# Patient Record
Sex: Female | Born: 1980 | Race: White | Hispanic: No | Marital: Married | Smoking: Never smoker
Health system: Southern US, Community
[De-identification: ages and names within clinical notes are randomized; demographics above are authoritative.]

## PROBLEM LIST (undated history)

## (undated) DIAGNOSIS — R002 Palpitations: Secondary | ICD-10-CM

## (undated) DIAGNOSIS — I10 Essential (primary) hypertension: Secondary | ICD-10-CM

## (undated) HISTORY — DX: Palpitations: R00.2

## (undated) HISTORY — PX: NO PAST SURGERIES: SHX2092

---

## 2001-08-13 ENCOUNTER — Encounter: Payer: Self-pay | Admitting: Unknown Physician Specialty

## 2001-08-13 ENCOUNTER — Ambulatory Visit (HOSPITAL_COMMUNITY): Admission: RE | Admit: 2001-08-13 | Discharge: 2001-08-13 | Payer: Self-pay | Admitting: Unknown Physician Specialty

## 2013-05-01 HISTORY — PX: BREAST BIOPSY: SHX20

## 2014-03-04 ENCOUNTER — Other Ambulatory Visit: Payer: Self-pay | Admitting: Unknown Physician Specialty

## 2014-03-04 DIAGNOSIS — N6310 Unspecified lump in the right breast, unspecified quadrant: Secondary | ICD-10-CM

## 2014-03-09 ENCOUNTER — Other Ambulatory Visit: Payer: Self-pay | Admitting: Unknown Physician Specialty

## 2014-03-09 DIAGNOSIS — N6310 Unspecified lump in the right breast, unspecified quadrant: Secondary | ICD-10-CM

## 2014-03-12 ENCOUNTER — Ambulatory Visit
Admission: RE | Admit: 2014-03-12 | Discharge: 2014-03-12 | Disposition: A | Discharge status: Home / Self Care | Payer: 59 | Source: Ambulatory Visit | Attending: Unknown Physician Specialty | Admitting: Unknown Physician Specialty

## 2014-03-12 DIAGNOSIS — N6310 Unspecified lump in the right breast, unspecified quadrant: Secondary | ICD-10-CM

## 2015-11-19 ENCOUNTER — Ambulatory Visit: Payer: Self-pay

## 2015-11-19 ENCOUNTER — Ambulatory Visit (INDEPENDENT_AMBULATORY_CARE_PROVIDER_SITE_OTHER): Payer: 59

## 2015-11-19 ENCOUNTER — Ambulatory Visit (INDEPENDENT_AMBULATORY_CARE_PROVIDER_SITE_OTHER): Payer: 59 | Admitting: Podiatry

## 2015-11-19 ENCOUNTER — Encounter: Payer: Self-pay | Admitting: Podiatry

## 2015-11-19 VITALS — BP 137/89 | HR 82 | Resp 12

## 2015-11-19 DIAGNOSIS — M722 Plantar fascial fibromatosis: Secondary | ICD-10-CM

## 2015-11-19 DIAGNOSIS — M79671 Pain in right foot: Secondary | ICD-10-CM

## 2015-11-19 DIAGNOSIS — M79672 Pain in left foot: Secondary | ICD-10-CM

## 2015-11-19 MED ORDER — DICLOFENAC SODIUM 75 MG PO TBEC: 75.0000 mg | DELAYED_RELEASE_TABLET | Freq: Two times a day (BID) | ORAL | Status: DC

## 2015-11-19 MED ORDER — TRIAMCINOLONE ACETONIDE 10 MG/ML IJ SUSP
10.0000 mg | Freq: Once | INTRAMUSCULAR | Status: AC
  Administered 2015-11-19: 10 mg

## 2015-11-19 NOTE — Patient Instructions (Signed)
Plantar Fasciitis Plantar fasciitis is a painful foot condition that affects the heel. It occurs when the band of tissue that connects the toes to the heel bone (plantar fascia) becomes irritated. This can happen after exercising too much or doing other repetitive activities (overuse injury). The pain from plantar fasciitis can range from mild irritation to severe pain that makes it difficult for you to walk or move. The pain is usually worse in the morning or after you have been sitting or lying down for a while. CAUSES This condition may be caused by:  Standing for long periods of time.  Wearing shoes that do not fit.  Doing high-impact activities, including running, aerobics, and ballet.  Being overweight.  Having an abnormal way of walking (gait).  Having tight calf muscles.  Having high arches in your feet.  Starting a new athletic activity. SYMPTOMS The main symptom of this condition is heel pain. Other symptoms include:  Pain that gets worse after activity or exercise.  Pain that is worse in the morning or after resting.  Pain that goes away after you walk for a few minutes. DIAGNOSIS This condition may be diagnosed based on your signs and symptoms. Your health care provider will also do a physical exam to check for:  A tender area on the bottom of your foot.  A high arch in your foot.  Pain when you move your foot.  Difficulty moving your foot. You may also need to have imaging studies to confirm the diagnosis. These can include:  X-rays.  Ultrasound.  MRI. TREATMENT  Treatment for plantar fasciitis depends on the severity of the condition. Your treatment may include:  Rest, ice, and over-the-counter pain medicines to manage your pain.  Exercises to stretch your calves and your plantar fascia.  A splint that holds your foot in a stretched, upward position while you sleep (night splint).  Physical therapy to relieve symptoms and prevent problems in the  future.  Cortisone injections to relieve severe pain.  Extracorporeal shock wave therapy (ESWT) to stimulate damaged plantar fascia with electrical impulses. It is often used as a last resort before surgery.  Surgery, if other treatments have not worked after 12 months. HOME CARE INSTRUCTIONS  Take medicines only as directed by your health care provider.  Avoid activities that cause pain.  Roll the bottom of your foot over a bag of ice or a bottle of cold water. Do this for 20 minutes, 3-4 times a day.  Perform simple stretches as directed by your health care provider.  Try wearing athletic shoes with air-sole or gel-sole cushions or soft shoe inserts.  Wear a night splint while sleeping, if directed by your health care provider.  Keep all follow-up appointments with your health care provider. PREVENTION   Do not perform exercises or activities that cause heel pain.  Consider finding low-impact activities if you continue to have problems.  Lose weight if you need to. The best way to prevent plantar fasciitis is to avoid the activities that aggravate your plantar fascia. SEEK MEDICAL CARE IF:  Your symptoms do not go away after treatment with home care measures.  Your pain gets worse.  Your pain affects your ability to move or do your daily activities.   This information is not intended to replace advice given to you by your health care provider. Make sure you discuss any questions you have with your health care provider.   Document Released: 01/10/2001 Document Revised: 01/06/2015 Document Reviewed: 02/25/2014 Elsevier   Interactive Patient Education 2016 Elsevier Inc.  

## 2015-11-19 NOTE — Progress Notes (Signed)
   Subjective:    Patient ID: Jeanette Morales, female    DOB: 1981/03/13, 35 y.o.   MRN: [id number]  HPI  Chief Complaint  Patient presents with  . Plantar Fasciitis    RT BOTTOM OF THE HEEL IS PAINFUL FOR ABOUT 1 YEAR. HEEL IS WORSE WHEN SITTING FOR LONG TERM THE STAND UP. TRIED ICE, TAPING, NSAID-NO RELIEF.     Review of Systems  Musculoskeletal: Positive for gait problem.       Objective:   Physical Exam        Assessment & Plan:

## 2015-11-24 NOTE — Progress Notes (Signed)
Subjective:     Patient ID: Jeanette Morales, female   DOB: 12/10/1980, 35 y.o.   MRN: [id number]  HPI patient states that she's having a lot of pain in the right heel for the last year 2 and also she has a long-term history of pain in the forefoot left with sesamoid injury which is been treated with several cortisone injections which have not given relief and previous MRI indicated that there was damage to the fibular sesamoid according to patient   Review of Systems  All other systems reviewed and are negative.      Objective:   Physical Exam  Constitutional: She is oriented to person, place, and time.  Cardiovascular: Intact distal pulses.   Musculoskeletal: Normal range of motion.  Neurological: She is oriented to person, place, and time.  Skin: Skin is warm.  Nursing note and vitals reviewed.  neurovascular status intact muscle strength adequate range of motion within normal limits with patient found to have exquisite discomfort plantar aspect right heel at the insertional point of the tendon into the calcaneus with fluid buildup around the medial band and also noted to have hypertrophy and irritation around the fibular sesamoid left with history of problems with this for at least 5 years. 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 Today were to focus on the heel and I injected the plantar fascia 3 mg Kenalog 5 mg Xylocaine and applied fascial brace on with instructions on usage along with diclofenac 75 mg twice a day. I then discussed the fibular sesamoid with given his 5 year history that it's most likely going to need to be removed and I reviewed that fact with her  X-ray report indicated plantar spur right and on the left it did indicate there is been some reactive changes  around the fibular sesamoid

## 2015-12-03 ENCOUNTER — Ambulatory Visit (INDEPENDENT_AMBULATORY_CARE_PROVIDER_SITE_OTHER): Payer: 59 | Admitting: Podiatry

## 2015-12-03 ENCOUNTER — Encounter: Payer: Self-pay | Admitting: Podiatry

## 2015-12-03 ENCOUNTER — Ambulatory Visit (INDEPENDENT_AMBULATORY_CARE_PROVIDER_SITE_OTHER): Payer: 59

## 2015-12-03 VITALS — BP 117/77 | HR 84 | Resp 12

## 2015-12-03 DIAGNOSIS — M79671 Pain in right foot: Secondary | ICD-10-CM

## 2015-12-03 DIAGNOSIS — M779 Enthesopathy, unspecified: Secondary | ICD-10-CM

## 2015-12-03 DIAGNOSIS — M722 Plantar fascial fibromatosis: Secondary | ICD-10-CM

## 2015-12-05 NOTE — Progress Notes (Signed)
Subjective:     Patient ID: Jeanette Morales, female   DOB: 1980-10-11, 35 y.o.   MRN: [id number]  HPI patient presents stating that her heel feels quite a bit better but she still has pain in the fibular sesamoidal area left or in the under part of the foot   Review of Systems     Objective:   Physical Exam Neurovascular status intact muscle strength adequate with patient found to have diminished discomfort in the plantar fascia right and is also noted to have discomfort around the fibular complex left which appears to be more fibular then tibial    Assessment:     Improving plantar fasciitis right but still present with fibular probable sesamoiditis left    Plan:     H&P and x-ray of the left foot with marker was reviewed. Today I went ahead and I scanned for custom orthotics to reduce plantar pressure against the fibular complex and also the right heel and discussed that fibular sesamoidectomy may be necessary one point in the future  X-ray indicated that the marker is in proximity to the fibular sesamoid left

## 2015-12-06 ENCOUNTER — Encounter: Payer: Self-pay | Admitting: *Deleted

## 2015-12-06 ENCOUNTER — Other Ambulatory Visit: Payer: Self-pay | Admitting: *Deleted

## 2015-12-07 ENCOUNTER — Ambulatory Visit (INDEPENDENT_AMBULATORY_CARE_PROVIDER_SITE_OTHER): Payer: 59 | Admitting: Cardiovascular Disease

## 2015-12-07 ENCOUNTER — Encounter: Payer: Self-pay | Admitting: Cardiovascular Disease

## 2015-12-07 VITALS — BP 126/84 | HR 73 | Ht 64.0 in | Wt 195.0 lb

## 2015-12-07 DIAGNOSIS — R002 Palpitations: Secondary | ICD-10-CM | POA: Diagnosis not present

## 2015-12-07 DIAGNOSIS — Z713 Dietary counseling and surveillance: Secondary | ICD-10-CM

## 2015-12-07 DIAGNOSIS — Z7189 Other specified counseling: Secondary | ICD-10-CM

## 2015-12-07 DIAGNOSIS — E785 Hyperlipidemia, unspecified: Secondary | ICD-10-CM

## 2015-12-07 DIAGNOSIS — Z7182 Exercise counseling: Secondary | ICD-10-CM

## 2015-12-07 NOTE — Patient Instructions (Signed)
Medication Instructions:  Continue all current medications.  Labwork: None.   Testing/Procedures: None.  Follow-Up: As needed.   Any Other Special Instructions Will Be Listed Below (If Applicable).  If you need a refill on your cardiac medications before your next appointment, please call your pharmacy.  

## 2015-12-07 NOTE — Progress Notes (Signed)
CARDIOLOGY CONSULT NOTE  Patient ID: Jeanette Morales MRN: [id number] DOB/AGE: 1980-06-10 35 y.o.  Admit date: (Not on file) Primary Physician: Dorthea Cove, MD Referring Physician: Dimas Aguas MD  Reason for Consultation: palpitations  HPI: The patient is a 35 year old woman who presents for the evaluation of palpitations. ECG in late July demonstrated sinus rhythm with PVCs. She went to the podiatrist who palpated her pedal pulses and told her she needs an ECG. She occasionally has palpitations but they are not debilitating. They're not associated with chest pain, shortness of breath, or dizziness. She says she does not sleep well as it is often interrupted. Denies sleep apnea. She said she has hyperlipidemia and was told she needs medications but prefers to try diet and exercise. She previously lost 65 pounds and got on oh 117 pounds but all this weight back on. She says "I love to eat ".  Fam: Father had stents at 8. Has HTN.   Allergies  Allergen Reactions  . Lexapro [Escitalopram Oxalate]     agitation    Current Outpatient Prescriptions  Medication Sig Dispense Refill  . cyclobenzaprine (FLEXERIL) 10 MG tablet Take 1 tablet by mouth 2 (two) times daily as needed.  0  . diclofenac (VOLTAREN) 75 MG EC tablet Take 1 tablet (75 mg total) by mouth 2 (two) times daily. (Patient taking differently: Take 75 mg by mouth 2 (two) times daily as needed. ) 50 tablet 2  . JUNEL 1/20 1-20 MG-MCG tablet Take 1 tablet by mouth daily.  4  . meloxicam (MOBIC) 15 MG tablet Take 15 mg by mouth daily as needed.   2  . traZODone (DESYREL) 50 MG tablet Take 50 mg by mouth at bedtime as needed.   0   No current facility-administered medications for this visit.     Past Medical History:  Diagnosis Date  . Palpitations     Past Surgical History:  Procedure Laterality Date  . NO PAST SURGERIES      Social History   Social History  . Marital status: Married    Spouse name: N/A  .  Number of children: N/A  . Years of education: N/A   Occupational History  . Not on file.   Social History Main Topics  . Smoking status: Never Smoker  . Smokeless tobacco: Not on file  . Alcohol use No  . Drug use: No  . Sexual activity: Not on file   Other Topics Concern  . Not on file   Social History Narrative  . No narrative on file       Prior to Admission medications   Medication Sig Start Date End Date Taking? Authorizing Provider  cyclobenzaprine (FLEXERIL) 10 MG tablet Take 1 tablet by mouth 2 (two) times daily as needed. 11/12/15   Historical Provider, MD  diclofenac (VOLTAREN) 75 MG EC tablet Take 1 tablet (75 mg total) by mouth 2 (two) times daily. 11/19/15   Lenn Sink, DPM  JUNEL 1/20 1-20 MG-MCG tablet Take 1 tablet by mouth daily. 09/30/15   Historical Provider, MD  meloxicam (MOBIC) 15 MG tablet Take 15 mg by mouth daily. 09/01/15   Historical Provider, MD  traZODone (DESYREL) 50 MG tablet Take 50 mg by mouth at bedtime. 11/19/15   Historical Provider, MD     Review of systems complete and found to be negative unless listed above in HPI     Physical exam Blood pressure 126/84, pulse 73, height  (1.626  m), weight 195 lb (88.5 kg). General: NAD Neck: No JVD, no thyromegaly or thyroid nodule.  Lungs: Clear to auscultation bilaterally with normal respiratory effort. CV: Nondisplaced PMI. Regular rate and rhythm, normal S1/S2, no S3/S4, no murmur.  No peripheral edema.  No carotid bruit.  Normal pedal pulses.  Abdomen: Soft, nontender, obese.  Skin: Intact without lesions or rashes.  Neurologic: Alert and oriented x 3.  Psych: Normal affect. Extremities: No clubbing or cyanosis.  HEENT: Normal.   ECG: Most recent ECG reviewed.  Labs:  No results found for: WBC, HGB, HCT, MCV, PLT No results for input(s): NA, K, CL, CO2, BUN, CREATININE, CALCIUM, PROT, BILITOT, ALKPHOS, ALT, AST, GLUCOSE in the last 168 hours.  Invalid input(s): LABALBU No  results found for: CKTOTAL, CKMB, CKMBINDEX, TROPONINI No results found for: CHOL No results found for: HDL No results found for: LDLCALC No results found for: TRIG No results found for: CHOLHDL No results found for: LDLDIRECT       Studies: No results found.  ASSESSMENT AND PLAN:  1. Palpitations: Rarely symptomatic. Benign. No medical therapy warranted at this point.  2. Obesity: Exercise and dietary counseling provided.  3. Hyperlipidemia: Pt wants to attempt diet/exercise.   Dispo: prn.   Signed: Prentice DockerSuresh Kosei Rhodes, M.D., F.A.C.C.  12/07/2015, 9:25 AM

## 2015-12-24 ENCOUNTER — Ambulatory Visit: Payer: 59 | Admitting: *Deleted

## 2015-12-24 DIAGNOSIS — M722 Plantar fascial fibromatosis: Secondary | ICD-10-CM

## 2015-12-24 NOTE — Progress Notes (Signed)
Patient ID: Jeanette Morales, female   DOB: 1980/06/03, 35 y.o.   MRN: [id number]  Patient presents for orthotic pick up.  Verbal and written break in and wear instructions given.  Patient will follow up in 4 weeks if symptoms worsen or fail to improve.

## 2015-12-24 NOTE — Patient Instructions (Signed)

## 2016-01-24 ENCOUNTER — Encounter: Payer: Self-pay | Admitting: Podiatry

## 2016-01-24 ENCOUNTER — Ambulatory Visit (INDEPENDENT_AMBULATORY_CARE_PROVIDER_SITE_OTHER): Payer: 59 | Admitting: Podiatry

## 2016-01-24 DIAGNOSIS — M8930 Hypertrophy of bone, unspecified site: Secondary | ICD-10-CM

## 2016-01-24 DIAGNOSIS — M722 Plantar fascial fibromatosis: Secondary | ICD-10-CM

## 2016-01-24 MED ORDER — TRIAMCINOLONE ACETONIDE 10 MG/ML IJ SUSP
10.0000 mg | Freq: Once | INTRAMUSCULAR | Status: AC
  Administered 2016-01-24: 10 mg

## 2016-01-25 NOTE — Progress Notes (Signed)
Subjective:     Patient ID: Jeanette Morales, female   DOB: January 27, 1981, 35 y.o.   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 Discussed removal of the fibular sesamoid left and patient wants to get this done but needs to wait till later in the year and I reviewed what would be required

## 2016-02-21 ENCOUNTER — Ambulatory Visit: Payer: 59 | Admitting: Podiatry

## 2016-03-17 ENCOUNTER — Ambulatory Visit: Payer: 59 | Admitting: Podiatry

## 2016-04-20 ENCOUNTER — Ambulatory Visit: Payer: 59 | Admitting: Podiatry

## 2017-05-01 HISTORY — PX: FOOT SURGERY: SHX648

## 2017-09-03 DIAGNOSIS — Z01419 Encounter for gynecological examination (general) (routine) without abnormal findings: Secondary | ICD-10-CM | POA: Diagnosis not present

## 2018-01-24 DIAGNOSIS — M79672 Pain in left foot: Secondary | ICD-10-CM | POA: Diagnosis not present

## 2018-01-24 DIAGNOSIS — M25872 Other specified joint disorders, left ankle and foot: Secondary | ICD-10-CM | POA: Diagnosis not present

## 2018-01-24 DIAGNOSIS — M79671 Pain in right foot: Secondary | ICD-10-CM | POA: Diagnosis not present

## 2018-01-24 DIAGNOSIS — M722 Plantar fascial fibromatosis: Secondary | ICD-10-CM | POA: Diagnosis not present

## 2018-01-24 DIAGNOSIS — M2580 Other specified joint disorders, unspecified joint: Secondary | ICD-10-CM | POA: Diagnosis not present

## 2018-02-04 DIAGNOSIS — M879 Osteonecrosis, unspecified: Secondary | ICD-10-CM | POA: Diagnosis not present

## 2018-02-04 DIAGNOSIS — M899 Disorder of bone, unspecified: Secondary | ICD-10-CM | POA: Diagnosis not present

## 2018-02-04 DIAGNOSIS — M87075 Idiopathic aseptic necrosis of left foot: Secondary | ICD-10-CM | POA: Diagnosis not present

## 2018-02-04 DIAGNOSIS — M79672 Pain in left foot: Secondary | ICD-10-CM | POA: Diagnosis not present

## 2018-03-15 DIAGNOSIS — M79672 Pain in left foot: Secondary | ICD-10-CM | POA: Diagnosis not present

## 2018-03-15 DIAGNOSIS — M6281 Muscle weakness (generalized): Secondary | ICD-10-CM | POA: Diagnosis not present

## 2018-03-15 DIAGNOSIS — M25675 Stiffness of left foot, not elsewhere classified: Secondary | ICD-10-CM | POA: Diagnosis not present

## 2018-04-05 DIAGNOSIS — M6281 Muscle weakness (generalized): Secondary | ICD-10-CM | POA: Diagnosis not present

## 2018-04-05 DIAGNOSIS — M25675 Stiffness of left foot, not elsewhere classified: Secondary | ICD-10-CM | POA: Diagnosis not present

## 2018-04-05 DIAGNOSIS — M79672 Pain in left foot: Secondary | ICD-10-CM | POA: Diagnosis not present

## 2019-09-10 ENCOUNTER — Telehealth: Payer: Self-pay | Admitting: Adult Health

## 2019-09-10 NOTE — Telephone Encounter (Signed)

## 2019-09-11 ENCOUNTER — Ambulatory Visit (INDEPENDENT_AMBULATORY_CARE_PROVIDER_SITE_OTHER): Payer: Managed Care, Other (non HMO) | Admitting: Adult Health

## 2019-09-11 ENCOUNTER — Encounter: Payer: Self-pay | Admitting: Adult Health

## 2019-09-11 ENCOUNTER — Other Ambulatory Visit: Payer: Self-pay

## 2019-09-11 VITALS — BP 141/92 | HR 82 | Ht 64.0 in | Wt 229.0 lb

## 2019-09-11 DIAGNOSIS — N9089 Other specified noninflammatory disorders of vulva and perineum: Secondary | ICD-10-CM | POA: Diagnosis not present

## 2019-09-11 NOTE — Progress Notes (Signed)
Patient ID: Jeanette Morales, female   DOB: [birth year], 39 y.o.   MRN: [id number] History of Present Illness:  Jeanette Morales is a 39 year old white female,married, G1P1, in as new pt, complaining of having irritation and pain in right labia since February, has seen Jeanette Morales and was treated for yeast, and herpes zoster, without relief, and she had biopsy Monday, but she does not feel it was at right spot. She tried temovate shirt time too. She says it seemed to start after having had steroids. Her HSV 1&2 cultures both negative and HSV IgG was negative.  PCP is Jeanette Cove MD.   Current Medications, Allergies, Past Medical History, Past Surgical History, Family History and Social History were reviewed in [employer] record.     Review of Systems: Has had irrigation and pain in right labia since February     Physical Exam:BP (!) 141/92 (BP Location: Left Arm, Patient Position: Sitting, Cuff Size: Large)   Pulse 82   Ht 5\' 4"  (1.626 m)   Wt 229 lb (103.9 kg)   LMP 08/21/2019 (Approximate)   BMI 39.31 kg/m  General:  Well developed, well nourished, no acute distress Skin:  Warm and dry Lungs; Clear to auscultation bilaterally Cardiovascular: Regular rate and rhythm Pelvic:  External genitalia is normal in appearance, the inner right labia minor has exudate from recent biopsy, and has an area that is puffy and raised, and tender. Psych:  No Morales changes, alert and cooperative,seems happy AA is o Fall risk is low PHQ 9 score is 10 no SI. Examination chaperoned by 08/23/2019 LPN   Impression and Plan: 1. Vulvar irritation  -Return in 2 weeks to recheck when healed and has biopsy pathology  Total time 35 minutes, reviewing Jeanette Morales records, exam and then discussing with her differential diagnosis of lichen simplex chronicus, and even SCC,showed her pictures in [facility] by Jeanette Morales.

## 2019-09-23 ENCOUNTER — Telehealth: Payer: Self-pay | Admitting: Adult Health

## 2019-09-23 NOTE — Telephone Encounter (Signed)

## 2019-09-24 ENCOUNTER — Encounter: Payer: Self-pay | Admitting: Adult Health

## 2019-09-24 ENCOUNTER — Ambulatory Visit (INDEPENDENT_AMBULATORY_CARE_PROVIDER_SITE_OTHER): Payer: Managed Care, Other (non HMO) | Admitting: Adult Health

## 2019-09-24 ENCOUNTER — Other Ambulatory Visit: Payer: Self-pay

## 2019-09-24 VITALS — BP 159/100 | HR 79 | Ht 65.0 in | Wt 232.0 lb

## 2019-09-24 DIAGNOSIS — N3281 Overactive bladder: Secondary | ICD-10-CM | POA: Insufficient documentation

## 2019-09-24 DIAGNOSIS — L900 Lichen sclerosus et atrophicus: Secondary | ICD-10-CM | POA: Diagnosis not present

## 2019-09-24 DIAGNOSIS — N9089 Other specified noninflammatory disorders of vulva and perineum: Secondary | ICD-10-CM | POA: Diagnosis not present

## 2019-09-24 DIAGNOSIS — N393 Stress incontinence (female) (male): Secondary | ICD-10-CM | POA: Insufficient documentation

## 2019-09-24 DIAGNOSIS — R030 Elevated blood-pressure reading, without diagnosis of hypertension: Secondary | ICD-10-CM | POA: Insufficient documentation

## 2019-09-24 DIAGNOSIS — L570 Actinic keratosis: Secondary | ICD-10-CM | POA: Insufficient documentation

## 2019-09-24 LAB — POCT URINALYSIS DIPSTICK
Blood, UA: NEGATIVE
Glucose, UA: NEGATIVE
Leukocytes, UA: NEGATIVE
Nitrite, UA: NEGATIVE
Protein, UA: NEGATIVE

## 2019-09-24 MED ORDER — LOSARTAN POTASSIUM 25 MG PO TABS: 25.0000 mg | ORAL_TABLET | Freq: Every day | ORAL | 3 refills | Status: DC

## 2019-09-24 MED ORDER — MIRABEGRON ER 25 MG PO TB24: 25.0000 mg | ORAL_TABLET | Freq: Every day | ORAL | 0 refills | Status: DC

## 2019-09-24 NOTE — Patient Instructions (Signed)
DASH Eating Plan DASH stands for "Dietary Approaches to Stop Hypertension." The DASH eating plan is a healthy eating plan that has been shown to reduce high blood pressure (hypertension). It may also reduce your risk for type 2 diabetes, heart disease, and stroke. The DASH eating plan may also help with weight loss. What are tips for following this plan?  General guidelines  Avoid eating more than 2,300 mg (milligrams) of salt (sodium) a day. If you have hypertension, you may need to reduce your sodium intake to 1,500 mg a day.  Limit alcohol intake to no more than 1 drink a day for nonpregnant women and 2 drinks a day for men. One drink equals 12 oz of beer, 5 oz of wine, or 1 oz of hard liquor.  Work with your health care provider to maintain a healthy body weight or to lose weight. Ask what an ideal weight is for you.  Get at least 30 minutes of exercise that causes your heart to beat faster (aerobic exercise) most days of the week. Activities may include walking, swimming, or biking.  Work with your health care provider or diet and nutrition specialist (dietitian) to adjust your eating plan to your individual calorie needs. Reading food labels   Check food labels for the amount of sodium per serving. Choose foods with less than 5 percent of the Daily Value of sodium. Generally, foods with less than 300 mg of sodium per serving fit into this eating plan.  To find whole grains, look for the word "whole" as the first word in the ingredient list. Shopping  Buy products labeled as "low-sodium" or "no salt added."  Buy fresh foods. Avoid canned foods and premade or frozen meals. Cooking  Avoid adding salt when cooking. Use salt-free seasonings or herbs instead of table salt or sea salt. Check with your health care provider or pharmacist before using salt substitutes.  Do not fry foods. Cook foods using healthy methods such as baking, boiling, grilling, and broiling instead.  Cook with  heart-healthy oils, such as olive, canola, soybean, or sunflower oil. Meal planning  Eat a balanced diet that includes: ? 5 or more servings of fruits and vegetables each day. At each meal, try to fill half of your plate with fruits and vegetables. ? Up to 6-8 servings of whole grains each day. ? Less than 6 oz of lean meat, poultry, or fish each day. A 3-oz serving of meat is about the same size as a deck of cards. One egg equals 1 oz. ? 2 servings of low-fat dairy each day. ? A serving of nuts, seeds, or beans 5 times each week. ? Heart-healthy fats. Healthy fats called Omega-3 fatty acids are found in foods such as flaxseeds and coldwater fish, like sardines, salmon, and mackerel.  Limit how much you eat of the following: ? Canned or prepackaged foods. ? Food that is high in trans fat, such as fried foods. ? Food that is high in saturated fat, such as fatty meat. ? Sweets, desserts, sugary drinks, and other foods with added sugar. ? Full-fat dairy products.  Do not salt foods before eating.  Try to eat at least 2 vegetarian meals each week.  Eat more home-cooked food and less restaurant, buffet, and fast food.  When eating at a restaurant, ask that your food be prepared with less salt or no salt, if possible. What foods are recommended? The items listed may not be a complete list. Talk with your dietitian about   what dietary choices are best for you. Grains Whole-grain or whole-wheat bread. Whole-grain or whole-wheat pasta. Brown rice. Oatmeal. Quinoa. Bulgur. Whole-grain and low-sodium cereals. Pita bread. Low-fat, low-sodium crackers. Whole-wheat flour tortillas. Vegetables Fresh or frozen vegetables (raw, steamed, roasted, or grilled). Low-sodium or reduced-sodium tomato and vegetable juice. Low-sodium or reduced-sodium tomato sauce and tomato paste. Low-sodium or reduced-sodium canned vegetables. Fruits All fresh, dried, or frozen fruit. Canned fruit in natural juice (without  added sugar). Meat and other protein foods Skinless chicken or turkey. Ground chicken or turkey. Pork with fat trimmed off. Fish and seafood. Egg whites. Dried beans, peas, or lentils. Unsalted nuts, nut butters, and seeds. Unsalted canned beans. Lean cuts of beef with fat trimmed off. Low-sodium, lean deli meat. Dairy Low-fat (1%) or fat-free (skim) milk. Fat-free, low-fat, or reduced-fat cheeses. Nonfat, low-sodium ricotta or cottage cheese. Low-fat or nonfat yogurt. Low-fat, low-sodium cheese. Fats and oils Soft margarine without trans fats. Vegetable oil. Low-fat, reduced-fat, or light mayonnaise and salad dressings (reduced-sodium). Canola, safflower, olive, soybean, and sunflower oils. Avocado. Seasoning and other foods Herbs. Spices. Seasoning mixes without salt. Unsalted popcorn and pretzels. Fat-free sweets. What foods are not recommended? The items listed may not be a complete list. Talk with your dietitian about what dietary choices are best for you. Grains Baked goods made with fat, such as croissants, muffins, or some breads. Dry pasta or rice meal packs. Vegetables Creamed or fried vegetables. Vegetables in a cheese sauce. Regular canned vegetables (not low-sodium or reduced-sodium). Regular canned tomato sauce and paste (not low-sodium or reduced-sodium). Regular tomato and vegetable juice (not low-sodium or reduced-sodium). Pickles. Olives. Fruits Canned fruit in a light or heavy syrup. Fried fruit. Fruit in cream or butter sauce. Meat and other protein foods Fatty cuts of meat. Ribs. Fried meat. Bacon. Sausage. Bologna and other processed lunch meats. Salami. Fatback. Hotdogs. Bratwurst. Salted nuts and seeds. Canned beans with added salt. Canned or smoked fish. Whole eggs or egg yolks. Chicken or turkey with skin. Dairy Whole or 2% milk, cream, and half-and-half. Whole or full-fat cream cheese. Whole-fat or sweetened yogurt. Full-fat cheese. Nondairy creamers. Whipped toppings.  Processed cheese and cheese spreads. Fats and oils Butter. Stick margarine. Lard. Shortening. Ghee. Bacon fat. Tropical oils, such as coconut, palm kernel, or palm oil. Seasoning and other foods Salted popcorn and pretzels. Onion salt, garlic salt, seasoned salt, table salt, and sea salt. Worcestershire sauce. Tartar sauce. Barbecue sauce. Teriyaki sauce. Soy sauce, including reduced-sodium. Steak sauce. Canned and packaged gravies. Fish sauce. Oyster sauce. Cocktail sauce. Horseradish that you find on the shelf. Ketchup. Mustard. Meat flavorings and tenderizers. Bouillon cubes. Hot sauce and Tabasco sauce. Premade or packaged marinades. Premade or packaged taco seasonings. Relishes. Regular salad dressings. Where to find more information:  National Heart, Lung, and Blood Institute: www.nhlbi.nih.gov  American Heart Association: www.heart.org Summary  The DASH eating plan is a healthy eating plan that has been shown to reduce high blood pressure (hypertension). It may also reduce your risk for type 2 diabetes, heart disease, and stroke.  With the DASH eating plan, you should limit salt (sodium) intake to 2,300 mg a day. If you have hypertension, you may need to reduce your sodium intake to 1,500 mg a day.  When on the DASH eating plan, aim to eat more fresh fruits and vegetables, whole grains, lean proteins, low-fat dairy, and heart-healthy fats.  Work with your health care provider or diet and nutrition specialist (dietitian) to adjust your eating plan to your   individual calorie needs. This information is not intended to replace advice given to you by your health care provider. Make sure you discuss any questions you have with your health care provider. Document Revised: 03/30/2017 Document Reviewed: 04/10/2016 Elsevier Patient Education  2020 Elsevier Inc.  

## 2019-09-24 NOTE — Progress Notes (Addendum)
  Subjective:     Patient ID: Jeanette Morales, female   DOB: 02-Apr-1981, 39 y.o.   MRN: [id number]  HPI Jeanette Morales is a 39 year old white female, married, G1P1, back for recheck of vulva area and review biopsy from Dr Mora Appl, she is better today and pathology showed mild superficial lichenoid lymphocytic infiltrate, no malignancy and prominent  sebaceous glands. PCP  Is Almond Lint MD at [facility].  Review of Systems vulva irritation is a little better today Has to pee very hour  Has UI if coughs, sneezes or jumps Reviewed past medical,surgical, social and family history. Reviewed medications and allergies.     Objective:   Physical Exam BP (!) 159/100 (BP Location: Right Arm, Patient Position: Sitting, Cuff Size: Large)   Pulse 79   Ht 5\' 5"  (1.651 m)   Wt 232 lb (105.2 kg)   LMP 09/03/2019 (Exact Date)   BMI 38.61 kg/m urine dipstick negative. Skin warm and dry.Pelvic: external genitalia is normal in appearance no lesions noted. Examination chaperoned by 11/03/2019 LPN    Assessment:     1. Vulvar irritation  2. Lichen sclerosus Use temovate bid for 2 weeks then 2-3 x a week has at home  3. OAB (overactive bladder)  Will try myrbetriq Meds ordered this encounter  Medications  . losartan (COZAAR) 25 MG tablet    Sig: Take 1 tablet (25 mg total) by mouth daily.    Dispense:  30 tablet    Refill:  3    Order Specific Question:   Supervising Provider    Answer:   Nance Pear, LUTHER H [[id number]]  . mirabegron ER (MYRBETRIQ) 25 MG TB24 tablet    Sig: Take 1 tablet (25 mg total) by mouth daily.    Dispense:  28 tablet    Refill:  0    Order Specific Question:   Supervising Provider    Answer:   Despina Hidden, LUTHER H [[id number]]    4. Stress incontinence Do kegels  5. Elevated BP without diagnosis of hypertension Will rx losartan and decrease salt and sugar Review DASH diet     Plan:     Return in 4 weeks for pap and physical and fasting labs

## 2019-10-24 ENCOUNTER — Telehealth: Payer: Self-pay | Admitting: Adult Health

## 2019-10-24 NOTE — Telephone Encounter (Signed)

## 2019-10-27 ENCOUNTER — Other Ambulatory Visit (HOSPITAL_COMMUNITY)
Admission: RE | Admit: 2019-10-27 | Discharge: 2019-10-27 | Disposition: A | Discharge status: Home / Self Care | Payer: Managed Care, Other (non HMO) | Source: Ambulatory Visit | Attending: Adult Health | Admitting: Adult Health

## 2019-10-27 ENCOUNTER — Encounter: Payer: Self-pay | Admitting: Adult Health

## 2019-10-27 ENCOUNTER — Ambulatory Visit (INDEPENDENT_AMBULATORY_CARE_PROVIDER_SITE_OTHER): Payer: Managed Care, Other (non HMO) | Admitting: Adult Health

## 2019-10-27 VITALS — BP 143/94 | HR 85 | Ht 64.0 in | Wt 227.0 lb

## 2019-10-27 DIAGNOSIS — Z3041 Encounter for surveillance of contraceptive pills: Secondary | ICD-10-CM | POA: Insufficient documentation

## 2019-10-27 DIAGNOSIS — L900 Lichen sclerosus et atrophicus: Secondary | ICD-10-CM | POA: Diagnosis not present

## 2019-10-27 DIAGNOSIS — Z01419 Encounter for gynecological examination (general) (routine) without abnormal findings: Secondary | ICD-10-CM | POA: Insufficient documentation

## 2019-10-27 DIAGNOSIS — I10 Essential (primary) hypertension: Secondary | ICD-10-CM

## 2019-10-27 DIAGNOSIS — N3281 Overactive bladder: Secondary | ICD-10-CM

## 2019-10-27 MED ORDER — JUNEL 1/20 1-20 MG-MCG PO TABS: 1.0000 | ORAL_TABLET | Freq: Every day | ORAL | 4 refills | Status: DC

## 2019-10-27 MED ORDER — LOSARTAN POTASSIUM 25 MG PO TABS: 25.0000 mg | ORAL_TABLET | Freq: Every day | ORAL | 3 refills | Status: DC

## 2019-10-27 MED ORDER — MIRABEGRON ER 25 MG PO TB24: 25.0000 mg | ORAL_TABLET | Freq: Every day | ORAL | 6 refills | Status: DC

## 2019-10-27 NOTE — Progress Notes (Signed)
Patient ID: Jeanette Morales, female   DOB: [birth year], 39 y.o.   MRN: [id number] History of Present Illness:  Jeanette Morales is a 38 year old white female, married, G1P1 in for well woman gyn exam and pap. She is happy with Junel 1/20 She did not take BP meds.  PCP is Dr Reuel Boom   Current Medications, Allergies, Past Medical History, Past Surgical History, Family History and Social History were reviewed in [employer] [facility] electronic medical record.     Review of Systems: Patient denies any headaches, hearing loss, fatigue, blurred vision, shortness of breath, chest pain, abdominal pain, problems with bowel movements,  or intercourse. No joint pain or mood swings. Still pees often but UI better on myrbetriq    Physical Exam:BP (!) 143/94 (BP Location: Right Arm, Patient Position: Sitting, Cuff Size: Large)   Pulse 85   Ht 5\' 4"  (1.626 m)   Wt 227 lb (103 kg)   LMP 10/22/2019   BMI 38.96 kg/m  General:  Well developed, well nourished, no acute distress Skin:  Warm and dry Neck:  Midline trachea, normal thyroid, good ROM, no lymphadenopathy Lungs; Clear to auscultation bilaterally Breast:  No dominant palpable mass, retraction, or nipple discharge, has regular irregularities right breast, has know fibroadenoma from 2015. Cardiovascular: Regular rate and rhythm Abdomen:  Soft, non tender, no hepatosplenomegaly Pelvic:  External genitalia is normal in appearance, area left labia looks normal now, no lesions.  The vagina is normal in appearance. Urethra has no lesions or masses. The cervix is bulbous.Pap with GC/CHL and high risk HPV 16/18 genotyping performed.  Uterus is felt to be normal size, shape, and contour.  No adnexal masses or tenderness noted.Bladder is non tender, no masses felt. Extremities/musculoskeletal:  No swelling or varicosities noted, no clubbing or cyanosis Psych:  No mood changes, alert and cooperative,seems happy Fall risk is low AA is 0 PHQ 9 score is 6, no SI, not depressed just  stressed at work(works at [employer]) and home Examination chaperoned by 10/24/2019 LPN.   Impression and Plan: 1. Encounter for gynecological examination with Papanicolaou smear of cervix Pap sent Physical in 1 year Pap in 3 if normal Get mammogram at 39  Check CBC,CMP,TSH and lipids  2. Lichen sclerosus Continue temovate 2-3 x weekly  3. OAB (overactive bladder) Continue myrbetriq 25 mg ,gave 28 tabs and rx sent   4. Encounter for surveillance of contraceptive pills Will refill OCs  5. Hypertension, unspecified type Take BP meds  Try wot work on weight  Decrease salt and sugar  See me in 6 weeks for weight and BP check and ROS.   Meds ordered this encounter  Medications  . JUNEL 1/20 1-20 MG-MCG tablet    Sig: Take 1 tablet by mouth daily.    Dispense:  84 tablet    Refill:  4    Order Specific Question:   Supervising Provider    Answer:   2/20, LUTHER H [[id number]]  . mirabegron ER (MYRBETRIQ) 25 MG TB24 tablet    Sig: Take 1 tablet (25 mg total) by mouth daily.    Dispense:  28 tablet    Refill:  6    Order Specific Question:   Supervising Provider    Answer:   Despina Hidden, LUTHER H [[id number]]  . losartan (COZAAR) 25 MG tablet    Sig: Take 1 tablet (25 mg total) by mouth daily.    Dispense:  30 tablet    Refill:  3    Order  Specific Question:   Supervising Provider    Answer:   Lazaro Arms [[id number]]

## 2019-10-28 ENCOUNTER — Telehealth: Payer: Self-pay | Admitting: Adult Health

## 2019-10-28 LAB — LIPID PANEL
Chol/HDL Ratio: 4.5 ratio — ABNORMAL HIGH (ref 0.0–4.4)
Cholesterol, Total: 259 mg/dL — ABNORMAL HIGH (ref 100–199)
HDL: 57 mg/dL (ref >39)
LDL Chol Calc (NIH): 178 mg/dL — ABNORMAL HIGH (ref 0–99)
Triglycerides: 135 mg/dL (ref 0–149)
VLDL Cholesterol Cal: 24 mg/dL (ref 5–40)

## 2019-10-28 LAB — CYTOLOGY - PAP
Chlamydia: NEGATIVE
Comment: NEGATIVE
Comment: NEGATIVE
Comment: NORMAL
Diagnosis: NEGATIVE
High risk HPV: NEGATIVE
Neisseria Gonorrhea: NEGATIVE

## 2019-10-28 LAB — COMPREHENSIVE METABOLIC PANEL
ALT: 18 IU/L (ref 0–32)
AST: 18 IU/L (ref 0–40)
Albumin/Globulin Ratio: 1.6 (ref 1.2–2.2)
Albumin: 4.5 g/dL (ref 3.8–4.8)
Alkaline Phosphatase: 108 IU/L (ref 48–121)
BUN/Creatinine Ratio: 19 (ref 9–23)
BUN: 14 mg/dL (ref 6–20)
Bilirubin Total: 0.3 mg/dL (ref 0.0–1.2)
CO2: 22 mmol/L (ref 20–29)
Calcium: 9.8 mg/dL (ref 8.7–10.2)
Chloride: 100 mmol/L (ref 96–106)
Creatinine, Ser: 0.73 mg/dL (ref 0.57–1.00)
GFR calc Af Amer: 120 mL/min/{1.73_m2} (ref >59)
GFR calc non Af Amer: 104 mL/min/{1.73_m2} (ref >59)
Globulin, Total: 2.9 g/dL (ref 1.5–4.5)
Glucose: 93 mg/dL (ref 65–99)
Potassium: 4.8 mmol/L (ref 3.5–5.2)
Sodium: 138 mmol/L (ref 134–144)
Total Protein: 7.4 g/dL (ref 6.0–8.5)

## 2019-10-28 LAB — CBC
Hematocrit: 42.6 % (ref 34.0–46.6)
Hemoglobin: 14 g/dL (ref 11.1–15.9)
MCH: 28.4 pg (ref 26.6–33.0)
MCHC: 32.9 g/dL (ref 31.5–35.7)
MCV: 86 fL (ref 79–97)
Platelets: 312 10*3/uL (ref 150–450)
RBC: 4.93 x10E6/uL (ref 3.77–5.28)
RDW: 13.3 % (ref 11.7–15.4)
WBC: 6.9 10*3/uL (ref 3.4–10.8)

## 2019-10-28 LAB — TSH: TSH: 3.02 u[IU]/mL (ref 0.450–4.500)

## 2019-10-28 NOTE — Telephone Encounter (Signed)
Reviewed labs with pt, total cholestenol and LDL elevated, decrease carbs and fats, walk or swim 30 minutes most days, will recheck in 1 year.All other labs good.

## 2019-10-29 ENCOUNTER — Telehealth: Payer: Self-pay | Admitting: Adult Health

## 2019-10-29 NOTE — Telephone Encounter (Signed)
Pt aware that insurance denied myrbetriq, can give some samples and that pap was negative for malignancy and HPV and GC/CHL

## 2019-11-14 ENCOUNTER — Ambulatory Visit (INDEPENDENT_AMBULATORY_CARE_PROVIDER_SITE_OTHER): Payer: Managed Care, Other (non HMO) | Admitting: Adult Health

## 2019-11-14 ENCOUNTER — Ambulatory Visit: Payer: Managed Care, Other (non HMO) | Admitting: Advanced Practice Midwife

## 2019-11-14 ENCOUNTER — Encounter: Payer: Self-pay | Admitting: Adult Health

## 2019-11-14 VITALS — BP 146/96 | HR 88 | Ht 64.0 in | Wt 233.5 lb

## 2019-11-14 DIAGNOSIS — N764 Abscess of vulva: Secondary | ICD-10-CM

## 2019-11-14 MED ORDER — SULFAMETHOXAZOLE-TRIMETHOPRIM 800-160 MG PO TABS
1.0000 | ORAL_TABLET | Freq: Two times a day (BID) | ORAL | 0 refills | Status: DC
Start: 2019-11-14 — End: 2019-11-20

## 2019-11-14 NOTE — Patient Instructions (Signed)
Skin Abscess  A skin abscess is an infected area on or under your skin that contains a collection of pus and other material. An abscess may also be called a furuncle, carbuncle, or boil. An abscess can occur in or on almost any part of your body. Some abscesses break open (rupture) on their own. Most continue to get worse unless they are treated. The infection can spread deeper into the body and eventually into your blood, which can make you feel ill. Treatment usually involves draining the abscess. What are the causes? An abscess occurs when germs, like bacteria, pass through your skin and cause an infection. This may be caused by:  A scrape or cut on your skin.  A puncture wound through your skin, including a needle injection or insect bite.  Blocked oil or sweat glands.  Blocked and infected hair follicles.  A cyst that forms beneath your skin (sebaceous cyst) and becomes infected. What increases the risk? This condition is more likely to develop in people who:  Have a weak body defense system (immune system).  Have diabetes.  Have dry and irritated skin.  Get frequent injections or use illegal IV drugs.  Have a foreign body in a wound, such as a splinter.  Have problems with their lymph system or veins. What are the signs or symptoms? Symptoms of this condition include:  A painful, firm bump under the skin.  A bump with pus at the top. This may break through the skin and drain. Other symptoms include:  Redness surrounding the abscess site.  Warmth.  Swelling of the lymph nodes (glands) near the abscess.  Tenderness.  A sore on the skin. How is this diagnosed? This condition may be diagnosed based on:  A physical exam.  Your medical history.  A sample of pus. This may be used to find out what is causing the infection.  Blood tests.  Imaging tests, such as an ultrasound, CT scan, or MRI. How is this treated? A small abscess that drains on its own may  not need treatment. Treatment for larger abscesses may include:  Moist heat or heat pack applied to the area several times a day.  A procedure to drain the abscess (incision and drainage).  Antibiotic medicines. For a severe abscess, you may first get antibiotics through an IV and then change to antibiotics by mouth. Follow these instructions at home: Medicines   Take over-the-counter and prescription medicines only as told by your health care provider.  If you were prescribed an antibiotic medicine, take it as told by your health care provider. Do not stop taking the antibiotic even if you start to feel better. Abscess care   If you have an abscess that has not drained, apply heat to the affected area. Use the heat source that your health care provider recommends, such as a moist heat pack or a heating pad. ? Place a towel between your skin and the heat source. ? Leave the heat on for 20-30 minutes. ? Remove the heat if your skin turns bright red. This is especially important if you are unable to feel pain, heat, or cold. You may have a greater risk of getting burned.  Follow instructions from your health care provider about how to take care of your abscess. Make sure you: ? Cover the abscess with a bandage (dressing). ? Change your dressing or gauze as told by your health care provider. ? Wash your hands with soap and water before you change the   dressing or gauze. If soap and water are not available, use hand sanitizer.  Check your abscess every day for signs of a worsening infection. Check for: ? More redness, swelling, or pain. ? More fluid or blood. ? Warmth. ? More pus or a bad smell. General instructions  To avoid spreading the infection: ? Do not share personal care items, towels, or hot tubs with others. ? Avoid making skin contact with other people.  Keep all follow-up visits as told by your health care provider. This is important. Contact a health care provider if  you have:  More redness, swelling, or pain around your abscess.  More fluid or blood coming from your abscess.  Warm skin around your abscess.  More pus or a bad smell coming from your abscess.  A fever.  Muscle aches.  Chills or a general ill feeling. Get help right away if you:  Have severe pain.  See red streaks on your skin spreading away from the abscess. Summary  A skin abscess is an infected area on or under your skin that contains a collection of pus and other material.  A small abscess that drains on its own may not need treatment.  Treatment for larger abscesses may include having a procedure to drain the abscess and taking an antibiotic. This information is not intended to replace advice given to you by your health care provider. Make sure you discuss any questions you have with your health care provider. Document Revised: 08/08/2018 Document Reviewed: 05/31/2017 Elsevier Patient Education  2020 Elsevier Inc.  

## 2019-11-14 NOTE — Progress Notes (Signed)
  Subjective:     Patient ID: Jeanette Morales, female   DOB: Jun 11, 1980, 39 y.o.   MRN: [id number]  HPI Jeanette Morales is a 39 year old white female, married, G1P1 in complaining of knot in vagina  for about 3 weeks,is getting bigger and hurts. PCP is Dr Reuel Boom.  Review of Systems Has knot in vagina  for about 3 weeks, is hurting and getting bigger Reviewed past medical,surgical, social and family history. Reviewed medications and allergies.     Objective:   Physical Exam BP (!) 146/96 (BP Location: Left Arm, Patient Position: Sitting, Cuff Size: Large)   Pulse 88   Ht 5\' 4"  (1.626 m)   Wt 233 lb 8 oz (105.9 kg)   LMP 10/22/2019   BMI 40.08 kg/m    Skin warm and dry.Pelvic: external genitalia is normal in appearance no lesions, vagina: left inner labia near base is 2 cm round, red, soft boil that is tender, and near it is small varicose vein, Dr 10/24/2019 in for co exam, recommends I&D, so with verbal consent, injected with 5 cc 2% lidocaine and cleansed with betadine, a #11 blade was used to make small incision and boil drained some pus material but mostly blood, culture obtained, area was deflated and she said it felt better, 4 x 4 placed and pad was given. AA is 0 Fall risk is low PHQ 9 score is 6, no SI Examination chaperoned by Despina Hidden LPN  Assessment:     1. Boil of vulva I&D done Wound culture sent rx septra ds Meds ordered this encounter  Medications  . sulfamethoxazole-trimethoprim (BACTRIM DS) 800-160 MG tablet    Sig: Take 1 tablet by mouth 2 (two) times daily. Take 1 bid    Dispense:  28 tablet    Refill:  0    Order Specific Question:   Supervising Provider    Answer:   Malachy Mood H [[id number]]  Can use warm compresses but do not squeeze hard, can use frozen peas for 10 minutes on if needed    Plan:    Review handout on abscess  Return in 6 days for recheck

## 2019-11-18 ENCOUNTER — Telehealth: Payer: Self-pay | Admitting: Adult Health

## 2019-11-18 LAB — WOUND CULTURE: Organism ID, Bacteria: NONE SEEN

## 2019-11-18 MED ORDER — PENICILLIN V POTASSIUM 500 MG PO TABS: 500.0000 mg | ORAL_TABLET | Freq: Four times a day (QID) | ORAL | 0 refills | Status: DC

## 2019-11-18 NOTE — Telephone Encounter (Signed)
Pt aware of culture will change antibiotic to PCN keep appt 11/20/19

## 2019-11-20 ENCOUNTER — Encounter: Payer: Self-pay | Admitting: Adult Health

## 2019-11-20 ENCOUNTER — Ambulatory Visit (INDEPENDENT_AMBULATORY_CARE_PROVIDER_SITE_OTHER): Payer: Managed Care, Other (non HMO) | Admitting: Adult Health

## 2019-11-20 ENCOUNTER — Ambulatory Visit: Payer: Managed Care, Other (non HMO) | Admitting: Adult Health

## 2019-11-20 VITALS — BP 138/88 | HR 81 | Ht 64.0 in | Wt 235.0 lb

## 2019-11-20 DIAGNOSIS — N764 Abscess of vulva: Secondary | ICD-10-CM

## 2019-11-20 DIAGNOSIS — N3281 Overactive bladder: Secondary | ICD-10-CM | POA: Diagnosis not present

## 2019-11-20 NOTE — Progress Notes (Addendum)
  Subjective:     Patient ID: Jeanette Morales, female   DOB: 07/22/1980, 39 y.o.   MRN: [id number]  HPI Adelaida is a 39 year old white female, married, back in follow up on boil left vulva was I&D'd and received septra ds, then culture back and + for enterococcus and started on PCN, feels much better, does burn when pee hits it.  PCP is Dr Reuel Boom   Review of Systems Feels better, in vaginal area Still has urinary frequency,but better with myrbetriq   Reviewed past medical,surgical, social and family history. Reviewed medications and allergies.     Objective:   Physical Exam BP 138/88 (BP Location: Left Arm, Patient Position: Sitting, Cuff Size: Normal)   Pulse 81   Ht 5\' 4"  (1.626 m)   Wt 235 lb (106.6 kg)   LMP 10/22/2019   BMI 40.34 kg/m    BP better Skin warm and dry  The boil left vulva resolving, has pin hole, no swelling or redness. Semira gave verbal consent for exam without chaperone.   Assessment:     1. Boil of vulva, resolving Finish PCN  2. OAB (overactive bladder) Continue myrbetriq 25 mg 1 daily samples given    Plan:     Keep appointment  for 12/08/19 to check BP

## 2019-11-24 ENCOUNTER — Telehealth: Payer: Self-pay | Admitting: Adult Health

## 2019-11-24 NOTE — Telephone Encounter (Signed)
Pt would like a call from either a nurse or Jeanette Morales today/

## 2019-11-24 NOTE — Telephone Encounter (Signed)
Telephoned patient at home number. Patient has another place come up on vulva. Patient states started out red and is now hard. Patient currently taking Penicillin. Patient is going out of town on Friday and wonders if the medication she is on will resolve current problem or is there something else she needs to do. Advised would send to provider for suggestions. Patient voiced understanding.

## 2019-11-24 NOTE — Telephone Encounter (Signed)
Keep taking PCN if not better by Thursday call me and I will see you

## 2019-12-01 ENCOUNTER — Telehealth: Payer: Self-pay | Admitting: Adult Health

## 2019-12-01 NOTE — Telephone Encounter (Signed)
Patient called stating that she would like a call back from Either [place] or her Nurse, Patient did not state the reason for the call. Please contact pt

## 2019-12-01 NOTE — Telephone Encounter (Signed)
Telephoned patient at home number and left message to return call.  

## 2019-12-01 NOTE — Telephone Encounter (Signed)
Having swelling in feet and ankles, probably not BP meds  Keep watch

## 2019-12-01 NOTE — Telephone Encounter (Signed)
Patient returned call. Patient states ankles are swollen and feel tight. She is concerned it is from the blood pressure medication. Patient has not checked blood pressure. Advised patient to decrease salt intake and elevate feet. Patient states not able to check blood pressure due to being on vacation. Advised patient would send to provider. Patient voiced understanding.

## 2019-12-08 ENCOUNTER — Encounter: Payer: Self-pay | Admitting: Adult Health

## 2019-12-08 ENCOUNTER — Ambulatory Visit (INDEPENDENT_AMBULATORY_CARE_PROVIDER_SITE_OTHER): Payer: Managed Care, Other (non HMO) | Admitting: Adult Health

## 2019-12-08 VITALS — BP 129/84 | HR 76 | Ht 64.0 in | Wt 236.0 lb

## 2019-12-08 DIAGNOSIS — I10 Essential (primary) hypertension: Secondary | ICD-10-CM

## 2019-12-08 MED ORDER — LOSARTAN POTASSIUM 25 MG PO TABS: 25.0000 mg | ORAL_TABLET | Freq: Every day | ORAL | 3 refills | Status: DC

## 2019-12-08 NOTE — Progress Notes (Signed)
°  Subjective:     Patient ID: Jeanette Morales, female   DOB: March 25, 1981, 39 y.o.   MRN: [id number]  HPI Asra is a 39 year old white female,marrried, G2P1002, in for BP check, she is taking losartan. PCP is Dr Reuel Boom   Review of Systems Had swelling in feet and ankles at the beach Still having to pee often,on myrbetriq 25 mg Has noticed tickle in her throat for about 1-2 weeks,no cough Reviewed past medical,surgical, social and family history. Reviewed medications and allergies.     Objective:   Physical Exam BP 129/84 (BP Location: Left Arm, Patient Position: Sitting, Cuff Size: Large)    Pulse 76    Ht 5\' 4"  (1.626 m)    Wt 236 lb (107 kg)    LMP 11/10/2019 (Approximate)    BMI 40.51 kg/m  Skin warm and dry.  Lungs: clear to ausculation bilaterally. Cardiovascular: regular rate and rhythm.   No swelling in LE.  Assessment:     1. Hypertension, unspecified type Continue losartan    Plan:     Try taking 2 myrbetriq and let me know if helps, if not will refer to urology Follow up in 3 months of sooner if needed

## 2019-12-22 ENCOUNTER — Telehealth: Payer: Self-pay | Admitting: Adult Health

## 2019-12-22 NOTE — Telephone Encounter (Signed)
Telephoned patient at home number and advised patient samples were up front.

## 2019-12-22 NOTE — Telephone Encounter (Signed)
Patient states she is out of the sample medicine myrbetriq 50 mg

## 2020-01-16 ENCOUNTER — Other Ambulatory Visit: Payer: Self-pay | Admitting: Family Medicine

## 2020-01-16 DIAGNOSIS — Z1231 Encounter for screening mammogram for malignant neoplasm of breast: Secondary | ICD-10-CM

## 2020-01-21 ENCOUNTER — Other Ambulatory Visit: Payer: Self-pay

## 2020-01-21 ENCOUNTER — Ambulatory Visit
Admission: RE | Admit: 2020-01-21 | Discharge: 2020-01-21 | Disposition: A | Discharge status: Home / Self Care | Payer: Managed Care, Other (non HMO) | Source: Ambulatory Visit | Attending: Family Medicine | Admitting: Family Medicine

## 2020-01-21 DIAGNOSIS — Z1231 Encounter for screening mammogram for malignant neoplasm of breast: Secondary | ICD-10-CM

## 2020-02-04 ENCOUNTER — Telehealth: Payer: Self-pay | Admitting: Adult Health

## 2020-02-04 NOTE — Telephone Encounter (Signed)
Patient called stating that she is needing samples of her medication that Victorino Dike has given her before. Pt states that Victorino Dike told her once she runs out to call the office and we could give her more samples. Please contact pt

## 2020-02-04 NOTE — Telephone Encounter (Signed)
Telephoned patient at home number and patient states would like samples of Myrbetriq 50 mg. Advised would send message to provider and call back. Patient voiced understanding.

## 2020-02-04 NOTE — Telephone Encounter (Signed)
Telephoned patient at home number and advised patient samples were up front. Patient voiced understanding.

## 2020-02-09 ENCOUNTER — Other Ambulatory Visit: Payer: Self-pay | Admitting: Adult Health

## 2020-02-09 MED ORDER — LOSARTAN POTASSIUM 25 MG PO TABS: 25.0000 mg | ORAL_TABLET | Freq: Every day | ORAL | 3 refills | Status: DC

## 2020-03-09 ENCOUNTER — Telehealth: Payer: Self-pay | Admitting: Women's Health

## 2020-03-09 ENCOUNTER — Ambulatory Visit: Payer: Managed Care, Other (non HMO) | Admitting: Adult Health

## 2020-03-09 NOTE — Telephone Encounter (Signed)
Patient wants to see if she can get more samples of of myrbetrtiq

## 2020-03-09 NOTE — Telephone Encounter (Signed)
Myrbetriq 50 mg samples given. 5 boxes. Lot # [id number] exp 09/2020. Pt aware. JSY

## 2020-03-22 ENCOUNTER — Ambulatory Visit: Payer: Managed Care, Other (non HMO) | Admitting: Adult Health

## 2020-03-31 ENCOUNTER — Ambulatory Visit (INDEPENDENT_AMBULATORY_CARE_PROVIDER_SITE_OTHER): Payer: Managed Care, Other (non HMO) | Admitting: Adult Health

## 2020-03-31 ENCOUNTER — Other Ambulatory Visit: Payer: Self-pay

## 2020-03-31 ENCOUNTER — Encounter: Payer: Self-pay | Admitting: Adult Health

## 2020-03-31 VITALS — BP 139/90 | HR 65 | Ht 64.0 in | Wt 242.5 lb

## 2020-03-31 DIAGNOSIS — N3281 Overactive bladder: Secondary | ICD-10-CM | POA: Diagnosis not present

## 2020-03-31 DIAGNOSIS — I10 Essential (primary) hypertension: Secondary | ICD-10-CM

## 2020-03-31 MED ORDER — LOSARTAN POTASSIUM-HCTZ 50-12.5 MG PO TABS: 1.0000 | ORAL_TABLET | Freq: Every day | ORAL | 3 refills | Status: DC

## 2020-03-31 MED ORDER — GEMTESA 75 MG PO TABS: ORAL_TABLET | ORAL | 0 refills | Status: DC

## 2020-03-31 NOTE — Progress Notes (Signed)
  Subjective:     Patient ID: Jeanette Morales, female   DOB: 1980-11-30, 39 y.o.   MRN: [id number]  HPI Jeanette Morales is a 39 year old white female, married, G2P1002 in for BP check and follow up on OAB. PCP is Dr Reuel Boom.   Review of Systems Has urinary frequency still, some days better than others  Reviewed past medical,surgical, social and family history. Reviewed medications and allergies.     Objective:   Physical Exam BP 139/90 (BP Location: Left Arm, Patient Position: Sitting, Cuff Size: Large)   Pulse 65   Ht 5\' 4"  (1.626 m)   Wt 242 lb 8 oz (110 kg)   LMP 03/10/2020 (Approximate)   BMI 41.63 kg/m  Skin warm and dry.  Lungs: clear to ausculation bilaterally. Cardiovascular: regular rate and rhythm. Fall risk is low  Upstream - 03/31/20 1136      Pregnancy Intention Screening   Does the patient want to become pregnant in the next year? No    Does the patient's partner want to become pregnant in the next year? No    Would the patient like to discuss contraceptive options today? No      Contraception Wrap Up   Current Method Oral Contraceptive    End Method Oral Contraceptive    Contraception Counseling Provided No             Assessment:     1. Hypertension, unspecified type Will increase Losartan and add Microzide  Meds ordered this encounter  Medications  . DISCONTD: losartan-hydrochlorothiazide (HYZAAR) 50-12.5 MG tablet    Sig: Take 1 tablet by mouth daily.    Dispense:  30 tablet    Refill:  3    Order Specific Question:   Supervising Provider    Answer:   14/01/21, LUTHER H [[id number]]  . Vibegron (GEMTESA) 75 MG TABS    Sig: Take 1 daily    Dispense:  42 tablet    Refill:  0    Order Specific Question:   Supervising Provider    Answer:   EURE, LUTHER H [[id number]]  . losartan-hydrochlorothiazide (HYZAAR) 50-12.5 MG tablet    Sig: Take 1 tablet by mouth daily.    Dispense:  30 tablet    Refill:  3    Order Specific Question:   Supervising Provider    Answer:   Despina Hidden,  LUTHER H [[id number]]    2. OAB (overactive bladder) Stop myrbetriq and try gemtesa 75 mg 1 daily, samples given    Plan:     Follow up in 8 weeks

## 2020-05-26 ENCOUNTER — Ambulatory Visit: Payer: Managed Care, Other (non HMO) | Admitting: Adult Health

## 2020-12-09 ENCOUNTER — Other Ambulatory Visit: Payer: Self-pay | Admitting: Family Medicine

## 2020-12-09 DIAGNOSIS — Z1231 Encounter for screening mammogram for malignant neoplasm of breast: Secondary | ICD-10-CM

## 2021-01-27 ENCOUNTER — Ambulatory Visit
Admission: RE | Admit: 2021-01-27 | Discharge: 2021-01-27 | Disposition: A | Discharge status: Home / Self Care | Payer: 59 | Source: Ambulatory Visit | Attending: Family Medicine | Admitting: Family Medicine

## 2021-01-27 ENCOUNTER — Other Ambulatory Visit: Payer: Self-pay

## 2021-01-27 DIAGNOSIS — Z1231 Encounter for screening mammogram for malignant neoplasm of breast: Secondary | ICD-10-CM

## 2021-04-17 ENCOUNTER — Other Ambulatory Visit: Payer: Self-pay | Admitting: Adult Health

## 2021-08-26 ENCOUNTER — Other Ambulatory Visit: Payer: Self-pay | Admitting: Adult Health

## 2021-12-28 ENCOUNTER — Other Ambulatory Visit: Payer: Self-pay | Admitting: Adult Health

## 2022-01-12 ENCOUNTER — Other Ambulatory Visit: Payer: Self-pay | Admitting: Family Medicine

## 2022-01-12 DIAGNOSIS — Z1231 Encounter for screening mammogram for malignant neoplasm of breast: Secondary | ICD-10-CM

## 2022-02-01 ENCOUNTER — Ambulatory Visit
Admission: RE | Admit: 2022-02-01 | Discharge: 2022-02-01 | Disposition: A | Discharge status: Home / Self Care | Payer: BLUE CROSS/BLUE SHIELD | Source: Ambulatory Visit | Attending: Family Medicine | Admitting: Family Medicine

## 2022-02-01 DIAGNOSIS — Z1231 Encounter for screening mammogram for malignant neoplasm of breast: Secondary | ICD-10-CM

## 2022-04-10 ENCOUNTER — Other Ambulatory Visit: Payer: Self-pay

## 2022-04-10 MED ORDER — JUNEL 1/20 1-20 MG-MCG PO TABS: 1.0000 | ORAL_TABLET | Freq: Every day | ORAL | 4 refills | Status: DC

## 2022-04-10 NOTE — Telephone Encounter (Signed)
Patient called and stated that she needs a refill on her Junel and she wants it sent to the [facility] in [place].

## 2022-06-15 ENCOUNTER — Other Ambulatory Visit: Payer: Self-pay | Admitting: Adult Health

## 2022-11-15 ENCOUNTER — Encounter (HOSPITAL_COMMUNITY): Payer: Self-pay | Admitting: Emergency Medicine

## 2022-11-15 ENCOUNTER — Other Ambulatory Visit: Payer: Self-pay

## 2022-11-15 ENCOUNTER — Emergency Department (HOSPITAL_COMMUNITY): Payer: BC Managed Care – PPO

## 2022-11-15 ENCOUNTER — Emergency Department (HOSPITAL_COMMUNITY)
Admission: EM | Admit: 2022-11-15 | Discharge: 2022-11-15 | Disposition: A | Discharge status: Home / Self Care | Payer: BC Managed Care – PPO | Attending: Emergency Medicine | Admitting: Emergency Medicine

## 2022-11-15 DIAGNOSIS — I10 Essential (primary) hypertension: Secondary | ICD-10-CM | POA: Diagnosis not present

## 2022-11-15 DIAGNOSIS — R079 Chest pain, unspecified: Secondary | ICD-10-CM | POA: Diagnosis present

## 2022-11-15 DIAGNOSIS — Z79899 Other long term (current) drug therapy: Secondary | ICD-10-CM | POA: Diagnosis not present

## 2022-11-15 HISTORY — DX: Essential (primary) hypertension: I10

## 2022-11-15 LAB — CBC
HCT: 40.1 % (ref 36.0–46.0)
Hemoglobin: 13 g/dL (ref 12.0–15.0)
MCH: 27 pg (ref 26.0–34.0)
MCHC: 32.4 g/dL (ref 30.0–36.0)
MCV: 83.4 fL (ref 80.0–100.0)
Platelets: 289 10*3/uL (ref 150–400)
RBC: 4.81 MIL/uL (ref 3.87–5.11)
RDW: 15.1 % (ref 11.5–15.5)
WBC: 7.6 10*3/uL (ref 4.0–10.5)
nRBC: 0 % (ref 0.0–0.2)

## 2022-11-15 LAB — BASIC METABOLIC PANEL
Anion gap: 10 (ref 5–15)
BUN: 12 mg/dL (ref 6–20)
CO2: 23 mmol/L (ref 22–32)
Calcium: 9.2 mg/dL (ref 8.9–10.3)
Chloride: 100 mmol/L (ref 98–111)
Creatinine, Ser: 0.82 mg/dL (ref 0.44–1.00)
GFR, Estimated: >60 mL/min (ref >60)
Glucose, Bld: 92 mg/dL (ref 70–99)
Potassium: 3.5 mmol/L (ref 3.5–5.1)
Sodium: 133 mmol/L — ABNORMAL LOW (ref 135–145)

## 2022-11-15 LAB — TROPONIN I (HIGH SENSITIVITY)
Troponin I (High Sensitivity): 2 ng/L (ref <18)
Troponin I (High Sensitivity): 3 ng/L (ref <18)

## 2022-11-15 LAB — POC URINE PREG, ED: Preg Test, Ur: NEGATIVE

## 2022-11-15 MED ORDER — METHOCARBAMOL 500 MG PO TABS
500.0000 mg | ORAL_TABLET | Freq: Once | ORAL | Status: DC
  Filled 2022-11-15: qty 1

## 2022-11-15 MED ORDER — METHOCARBAMOL 500 MG PO TABS: 500.0000 mg | ORAL_TABLET | Freq: Two times a day (BID) | ORAL | 0 refills | Status: DC

## 2022-11-15 MED ORDER — ACETAMINOPHEN 500 MG PO TABS
1000.0000 mg | ORAL_TABLET | Freq: Once | ORAL | Status: DC
  Filled 2022-11-15: qty 2

## 2022-11-15 NOTE — ED Triage Notes (Signed)
Pt via POV c/o central squeezing chest pain intermittently, radiating to neck and mid back, since early this morning. Pt reports mild SOB but denies n/v/diaphoresis/dizziness. Denies significant PMH

## 2022-11-15 NOTE — ED Provider Notes (Signed)
[place] EMERGENCY DEPARTMENT AT [facility] Provider Note   CSN: [id number] Arrival date & time: 11/15/22  1353     History  Chief Complaint  Patient presents with   Chest Pain     Jeanette Morales is a 42 y.o. female with a history of hypertension presents today for evaluation of chest pain. She reports chest pain that started last night. Symptoms persist throughout the night that kept her from sleeping. Pain is located in the center of her chest. She described it as squeezing, radiating to neck and mid back. She denies any diaphoretics, arm weakness/numbness, nausea, vomiting. Pain is nonexertional, non pleuritic. Denies any personal or family history of DVT/PE, no recent travel or surgery, no cough, no leg swelling.    Chest Pain     Past Medical History:  Diagnosis Date   Hypertension    Palpitations    Past Surgical History:  Procedure Laterality Date   BREAST BIOPSY Right 2015   FOOT SURGERY Left 2019   NO PAST SURGERIES       Home Medications Prior to Admission medications   Medication Sig Start Date End Date Taking? Authorizing Provider  methocarbamol (ROBAXIN) 500 MG tablet Take 1 tablet (500 mg total) by mouth 2 (two) times daily. 11/15/22  Yes Jeanelle Malling, PA  DULoxetine (CYMBALTA) 30 MG capsule Take 30 mg by mouth daily. 03/09/20   [provider]  JUNEL 1/20 1-20 MG-MCG tablet Take 1 tablet by mouth daily. 04/10/22   Adline Potter, NP  losartan-hydrochlorothiazide (HYZAAR) 50-12.5 MG tablet TAKE 1 TABLET BY MOUTH DAILY 06/15/22   Adline Potter, NP  Vibegron [facility]) 75 MG TABS Take 1 daily 03/31/20   Adline Potter, NP      Allergies    Lexapro [escitalopram oxalate]    Review of Systems   Review of Systems  Cardiovascular:  Positive for chest pain.    Physical Exam Updated Vital Signs BP 111/69   Pulse 74   Temp 98.3 F (36.8 C) (Oral)   Resp 17   Ht 5\' 4"  (1.626 m)   Wt 104.3 kg   LMP 11/12/2022 (Approximate)    SpO2 97%   BMI 39.48 kg/m  Physical Exam Vitals and nursing note reviewed.  Constitutional:      Appearance: Normal appearance.  HENT:     Head: Normocephalic and atraumatic.     Mouth/Throat:     Mouth: Mucous membranes are moist.  Eyes:     General: No scleral icterus. Cardiovascular:     Rate and Rhythm: Normal rate and regular rhythm.     Pulses: Normal pulses.     Heart sounds: Normal heart sounds.  Pulmonary:     Effort: Pulmonary effort is normal.     Breath sounds: Normal breath sounds.  Abdominal:     General: Abdomen is flat.     Palpations: Abdomen is soft.     Tenderness: There is no abdominal tenderness.  Musculoskeletal:        General: No deformity.  Skin:    General: Skin is warm.     Findings: No rash.  Neurological:     General: No focal deficit present.     Mental Status: She is alert.  Psychiatric:        Mood and Affect: Mood normal.     ED Results / Procedures / Treatments   Labs (all labs ordered are listed, but only abnormal results are displayed) Labs Reviewed  BASIC  METABOLIC PANEL - Abnormal; Notable for the following components:      Result Value   Sodium 133 (*)    All other components within normal limits  CBC  POC URINE PREG, ED  TROPONIN I (HIGH SENSITIVITY)  TROPONIN I (HIGH SENSITIVITY)    EKG EKG Interpretation Date/Time:  Wednesday November 15 2022 14:08:15 EDT Ventricular Rate:  73 PR Interval:  134 QRS Duration:  72 QT Interval:  368 QTC Calculation: 405 R Axis:   -11  Text Interpretation: Sinus rhythm with occasional Premature ventricular complexes Inferior infarct , age undetermined Anterior infarct , age undetermined no acute ST/T changes Confirmed by Pricilla Loveless [phone]) on 11/15/2022 2:23:12 PM  Radiology DG Chest 2 View  Result Date: 11/15/2022 CLINICAL DATA:  CP EXAM: CHEST - 2 VIEW COMPARISON:  CXR 04/11/21 FINDINGS: No pleural effusion. No pneumothorax. No focal airspace opacity. Normal cardiac and  mediastinal contours. No radiographically apparent displaced rib fractures. Visualized upper abdomen is unremarkable. Vertebral body heights are maintained. IMPRESSION: No focal airspace opacity Electronically Signed   By: Lorenza Cambridge M.D.   On: 11/15/2022 15:03    Procedures Procedures    Medications Ordered in ED Medications  acetaminophen (TYLENOL) tablet 1,000 mg (1,000 mg Oral Patient Refused/Not Given 11/15/22 1645)  methocarbamol (ROBAXIN) tablet 500 mg (500 mg Oral Not Given 11/15/22 1645)    ED Course/ Medical Decision Making/ A&P                             Medical Decision Making Amount and/or Complexity of Data Reviewed Labs: ordered. Radiology: ordered.  Risk OTC drugs. Prescription drug management.   This patient presents to the ED for chest pain, this involves an extensive number of treatment options, and is a complaint that carries with a high risk of complications and morbidity.  The differential diagnosis includes ACS, pericarditis, PE, pneumothorax, pneumonia, less likely dissection with essentially normal blood pressure, symmetric bilateral pulses, and no back pain. This is not an exhaustive list.  Lab tests: I ordered and personally interpreted labs.  The pertinent results include: WBC unremarkable. Hbg unremarkable. Platelets unremarkable. Electrolytes unremarkable. BUN, creatinine unremarkable. Troponin negative. Pregnancy test was negative.  Imaging studies: I ordered imaging studies, personally reviewed, interpreted imaging and agree with the radiologist's interpretations. The results include: Chest XR was unremarkable.    Problem list/ ED course/ Critical interventions/ Medical management: HPI: See above Vital signs within normal range and stable throughout visit. Laboratory/imaging studies significant for: See above. On physical examination, patient is afebrile and appears in no acute distress. Exam without evidence of volume overload so doubt heart  failure. EKG without signs of active ischemia. Given the timing of pain to ER presentation, single troponin delta troponin was negative so doubt NSTEMI. Presentation not consistent with acute PE- patient is PERC negative, pneumothorax- not visualized on chest xr, pneumonia- no infectious symptoms, clear chest xr, myocarditis- no recent illness, neg trop. HEART score:1 so plan to discharge patient home with PMD follow up. I have reviewed the patient home medicines and have made adjustments as needed.  Cardiac monitoring/EKG: The patient was maintained on a cardiac monitor.  I personally reviewed and interpreted the cardiac monitor which showed an underlying rhythm of: sinus rhythm.  Additional history obtained: External records from outside source obtained and reviewed including: Chart review including previous notes, labs, imaging.  Consultations obtained:  Disposition Continued outpatient therapy. Follow-up with cardiology recommended for reevaluation of  symptoms. Treatment plan discussed with patient.  Pt acknowledged understanding was agreeable to the plan. Worrisome signs and symptoms were discussed with patient, and patient acknowledged understanding to return to the ED if they noticed these signs and symptoms. Patient was stable upon discharge.   This chart was dictated using voice recognition software.  Despite best efforts to proofread,  errors can occur which can change the documentation meaning.          Final Clinical Impression(s) / ED Diagnoses Final diagnoses:  Nonspecific chest pain    Rx / DC Orders ED Discharge Orders          Ordered    methocarbamol (ROBAXIN) 500 MG tablet  2 times daily        11/15/22 1806    Ambulatory referral to Cardiology       Comments: If you have not heard from the Cardiology office within the next 72 hours please call [phone].   11/15/22 1806              Jeanelle Malling, PA 11/16/22 2308    Vanetta Mulders, MD 11/20/22  [phone]

## 2022-11-15 NOTE — Discharge Instructions (Addendum)
Please take your medications as prescribed. Take tylenol/ibuprofen for pain. I recommend close follow-up with cardiology for reevaluation.  Please do not hesitate to return to emergency department if worrisome signs symptoms we discussed become apparent.

## 2022-11-30 ENCOUNTER — Other Ambulatory Visit: Payer: Self-pay | Admitting: Adult Health

## 2022-12-14 ENCOUNTER — Other Ambulatory Visit: Payer: Self-pay | Admitting: Family Medicine

## 2022-12-14 DIAGNOSIS — Z1231 Encounter for screening mammogram for malignant neoplasm of breast: Secondary | ICD-10-CM

## 2023-01-08 ENCOUNTER — Ambulatory Visit: Payer: BC Managed Care – PPO | Admitting: Internal Medicine

## 2023-01-11 ENCOUNTER — Ambulatory Visit (INDEPENDENT_AMBULATORY_CARE_PROVIDER_SITE_OTHER): Payer: BC Managed Care – PPO | Admitting: Adult Health

## 2023-01-11 ENCOUNTER — Other Ambulatory Visit (HOSPITAL_COMMUNITY)
Admission: RE | Admit: 2023-01-11 | Discharge: 2023-01-11 | Disposition: A | Discharge status: Home / Self Care | Payer: BC Managed Care – PPO | Source: Ambulatory Visit | Attending: Adult Health | Admitting: Adult Health

## 2023-01-11 ENCOUNTER — Encounter: Payer: Self-pay | Admitting: Adult Health

## 2023-01-11 VITALS — BP 128/82 | HR 78 | Ht 64.0 in | Wt 219.5 lb

## 2023-01-11 DIAGNOSIS — Z1151 Encounter for screening for human papillomavirus (HPV): Secondary | ICD-10-CM

## 2023-01-11 DIAGNOSIS — Z13220 Encounter for screening for lipoid disorders: Secondary | ICD-10-CM | POA: Diagnosis not present

## 2023-01-11 DIAGNOSIS — Z1329 Encounter for screening for other suspected endocrine disorder: Secondary | ICD-10-CM | POA: Diagnosis not present

## 2023-01-11 DIAGNOSIS — Z3041 Encounter for surveillance of contraceptive pills: Secondary | ICD-10-CM

## 2023-01-11 DIAGNOSIS — I10 Essential (primary) hypertension: Secondary | ICD-10-CM

## 2023-01-11 DIAGNOSIS — Z01419 Encounter for gynecological examination (general) (routine) without abnormal findings: Secondary | ICD-10-CM | POA: Diagnosis present

## 2023-01-11 MED ORDER — JUNEL 1/20 1-20 MG-MCG PO TABS: 1.0000 | ORAL_TABLET | Freq: Every day | ORAL | 4 refills | Status: DC

## 2023-01-11 MED ORDER — LOSARTAN POTASSIUM-HCTZ 50-12.5 MG PO TABS: 1.0000 | ORAL_TABLET | Freq: Every day | ORAL | 3 refills | Status: DC

## 2023-01-11 NOTE — Progress Notes (Signed)
Patient ID: Jeanette Morales, female   DOB: 1980/09/29, 42 y.o.   MRN: [id number] History of Present Illness: Jeanette Morales is a 42 year old white female, married, G2P1002, in for a well woman gyn exam and pap.  PCP is Dr Reuel Boom   Current Medications, Allergies, Past Medical History, Past Surgical History, Family History and Social History were reviewed in [facility] electronic medical record.     Review of Systems: Patient denies any headaches, hearing loss,  blurred vision, shortness of breath, chest pain, abdominal pain, problems with bowel movements, urination, or intercourse. No  mood swings.  She did have some chest pain in July and seen in ER  She has random joint pain and is tired a lot She is on compounded ozempic and has lost almost 50 lbs, she has lost her taste for sugar   Physical Exam:BP 128/82 (BP Location: Right Arm, Patient Position: Sitting, Cuff Size: Large)   Pulse 78   Ht 5\' 4"  (1.626 m)   Wt 219 lb 8 oz (99.6 kg)   LMP 12/11/2022 (Approximate)   BMI 37.68 kg/m   General:  Well developed, well nourished, no acute distress Skin:  Warm and dry Neck:  Midline trachea, normal thyroid, good ROM, no lymphadenopathy Lungs; Clear to auscultation bilaterally Breast:  No dominant palpable mass, retraction, or nipple discharge Cardiovascular: Regular rate and rhythm Abdomen:  Soft, non tender, no hepatosplenomegaly Pelvic:  External genitalia is normal in appearance, no lesions.  The vagina is normal in appearance. Urethra has no lesions or masses. The cervix is smooth, pap with HR HPV genotyping performed.  Uterus is felt to be normal size, shape, and contour.  No adnexal masses or tenderness noted.Bladder is non tender, no masses felt. Rectal: Deferred at her request Extremities/musculoskeletal:  No swelling or varicosities noted, no clubbing or cyanosis Psych:  No mood changes, alert and cooperative,seems happy AA is 0 Fall risk is low    01/11/2023    9:16 AM 10/27/2019     9:32 AM 09/11/2019    9:40 AM  Depression screen PHQ 2/9  Decreased Interest 1 0 2  Down, Depressed, Hopeless 0 0 2  PHQ - 2 Score 1 0 4  Altered sleeping 1 2 2   Tired, decreased energy 2 2 2   Change in appetite 0 2 2  Feeling bad or failure about yourself  0 0 0  Trouble concentrating 0 0 0  Moving slowly or fidgety/restless 0 0 0  Suicidal thoughts 0 0 0  PHQ-9 Score 4 6 10   Difficult doing work/chores  Not difficult at all Somewhat difficult       01/11/2023    9:17 AM 10/27/2019    9:32 AM 09/11/2019    9:41 AM  GAD 7 : Generalized Anxiety Score  Nervous, Anxious, on Edge 0 0 1  Control/stop worrying 0 0 1  Worry too much - different things 0 0 1  Trouble relaxing 0 0 1  Restless 0 0 1  Easily annoyed or irritable 0 0 1  Afraid - awful might happen 0 0 0  Total GAD 7 Score 0 0 6  Anxiety Difficulty   Somewhat difficult      Upstream - 01/11/23 7829       Pregnancy Intention Screening   Does the patient want to become pregnant in the next year? No    Does the patient's partner want to become pregnant in the next year? No    Would the patient like  to discuss contraceptive options today? No      Contraception Wrap Up   Current Method Oral Contraceptive    End Method Oral Contraceptive    Contraception Counseling Provided Yes            Examination chaperoned by Malachy Mood LPN  Impression and plan: 1. Encounter for gynecological examination with Papanicolaou smear of cervix Pap sent Pap in 3 years if normal Physical in 1 year Mammogram scheduled for 02/13/23 Will get fasting labs today   - Cytology - PAP( [facility]) - CBC - Comprehensive metabolic panel - Lipid panel  2. Hypertension, unspecified type BP is good will continue hyzaar Refilled hyzaar 50-12.5 mg 1 daily   Meds ordered this encounter  Medications   losartan-hydrochlorothiazide (HYZAAR) 50-12.5 MG tablet    Sig: Take 1 tablet by mouth daily.    Dispense:  90 tablet    Refill:  3     Order Specific Question:   Supervising Provider    Answer:   Lazaro Arms [[id number]]   JUNEL 1/20 1-20 MG-MCG tablet    Sig: Take 1 tablet by mouth daily.    Dispense:  84 tablet    Refill:  4    Order Specific Question:   Supervising Provider    Answer:   Despina Hidden, LUTHER H [[id number]]     3. Encounter for surveillance of contraceptive pills Happy with BCP, will refill junel 1-20  4. Screening for thyroid disorder - TSH  5. Screening cholesterol level - Lipid panel

## 2023-01-12 LAB — COMPREHENSIVE METABOLIC PANEL
ALT: 21 IU/L (ref 0–32)
AST: 19 IU/L (ref 0–40)
Albumin: 4.2 g/dL (ref 3.9–4.9)
Alkaline Phosphatase: 96 IU/L (ref 44–121)
BUN/Creatinine Ratio: 17 (ref 9–23)
BUN: 13 mg/dL (ref 6–24)
Bilirubin Total: 0.4 mg/dL (ref 0.0–1.2)
CO2: 23 mmol/L (ref 20–29)
Calcium: 9.7 mg/dL (ref 8.7–10.2)
Chloride: 99 mmol/L (ref 96–106)
Creatinine, Ser: 0.75 mg/dL (ref 0.57–1.00)
Globulin, Total: 2.9 g/dL (ref 1.5–4.5)
Glucose: 85 mg/dL (ref 70–99)
Potassium: 4.4 mmol/L (ref 3.5–5.2)
Sodium: 136 mmol/L (ref 134–144)
Total Protein: 7.1 g/dL (ref 6.0–8.5)
eGFR: 101 mL/min/{1.73_m2} (ref >59)

## 2023-01-12 LAB — LIPID PANEL
Chol/HDL Ratio: 4.9 ratio — ABNORMAL HIGH (ref 0.0–4.4)
Cholesterol, Total: 234 mg/dL — ABNORMAL HIGH (ref 100–199)
HDL: 48 mg/dL (ref >39)
LDL Chol Calc (NIH): 159 mg/dL — ABNORMAL HIGH (ref 0–99)
Triglycerides: 147 mg/dL (ref 0–149)
VLDL Cholesterol Cal: 27 mg/dL (ref 5–40)

## 2023-01-12 LAB — CBC
Hematocrit: 41.5 % (ref 34.0–46.6)
Hemoglobin: 13.3 g/dL (ref 11.1–15.9)
MCH: 27.1 pg (ref 26.6–33.0)
MCHC: 32 g/dL (ref 31.5–35.7)
MCV: 85 fL (ref 79–97)
Platelets: 307 10*3/uL (ref 150–450)
RBC: 4.91 x10E6/uL (ref 3.77–5.28)
RDW: 13.8 % (ref 11.7–15.4)
WBC: 6.4 10*3/uL (ref 3.4–10.8)

## 2023-01-12 LAB — TSH: TSH: 1.83 u[IU]/mL (ref 0.450–4.500)

## 2023-01-15 LAB — CYTOLOGY - PAP
Adequacy: ABSENT
Comment: NEGATIVE
Diagnosis: UNDETERMINED — AB
High risk HPV: NEGATIVE

## 2023-01-16 ENCOUNTER — Ambulatory Visit (INDEPENDENT_AMBULATORY_CARE_PROVIDER_SITE_OTHER): Payer: BC Managed Care – PPO | Admitting: Podiatry

## 2023-01-16 ENCOUNTER — Encounter: Payer: Self-pay | Admitting: Adult Health

## 2023-01-16 ENCOUNTER — Ambulatory Visit (INDEPENDENT_AMBULATORY_CARE_PROVIDER_SITE_OTHER): Payer: BC Managed Care – PPO

## 2023-01-16 DIAGNOSIS — M722 Plantar fascial fibromatosis: Secondary | ICD-10-CM | POA: Diagnosis not present

## 2023-01-16 DIAGNOSIS — R87610 Atypical squamous cells of undetermined significance on cytologic smear of cervix (ASC-US): Secondary | ICD-10-CM | POA: Insufficient documentation

## 2023-01-16 MED ORDER — MELOXICAM 15 MG PO TABS: 15.0000 mg | ORAL_TABLET | Freq: Every day | ORAL | 0 refills | Status: DC

## 2023-01-16 MED ORDER — METHYLPREDNISOLONE 4 MG PO TBPK: ORAL_TABLET | ORAL | 0 refills | Status: DC

## 2023-01-16 MED ORDER — DEXAMETHASONE SODIUM PHOSPHATE 120 MG/30ML IJ SOLN
4.0000 mg | Freq: Once | INTRAMUSCULAR | Status: AC
  Administered 2023-01-16: 4 mg via INTRA_ARTICULAR

## 2023-01-16 MED ORDER — TRIAMCINOLONE ACETONIDE 10 MG/ML IJ SUSP
2.5000 mg | Freq: Once | INTRAMUSCULAR | Status: AC
Start: 2023-01-16 — End: 2023-01-16
  Administered 2023-01-16: 2.5 mg via INTRA_ARTICULAR

## 2023-01-16 NOTE — Patient Instructions (Signed)

## 2023-01-16 NOTE — Progress Notes (Signed)
Subjective:  Patient ID: Jeanette Morales, female    DOB: July 04, 1980,   MRN: [id number]  No chief complaint on file.   42 y.o. female presents for concern of  right heel pain that has been ongoing for years. Has been seen in the past by Dr. Charlsie Merles for this but never truly went away.  Relates pain all the time currently. Has been stretching wearing support and massage. Has tried dry needling and other modalities. . Denies any other pedal complaints. Denies n/v/f/c.   Past Medical History:  Diagnosis Date   Hypertension    Palpitations     Objective:  Physical Exam: Vascular: DP/PT pulses 2/4 bilateral. CFT <3 seconds. Normal hair growth on digits. No edema.  Skin. No lacerations or abrasions bilateral feet.  Musculoskeletal: MMT 5/5 bilateral lower extremities in DF, PF, Inversion and Eversion. Deceased ROM in DF of ankle joint. Tender to the medial calcaneal tubercle right . No pain with achilles, PT or arch. No pain with calcaneal squeeze.   Neurological: Sensation intact to light touch.   Assessment:   1. Plantar fasciitis of right foot      Plan:  Patient was evaluated and treated and all questions answered. Discussed plantar fasciitis with patient.  X-rays reviewed and discussed with patient. No acute fractures or dislocations noted. Mild spurring noted at inferior calcaneus.  Discussed treatment options including, ice, NSAIDS, supportive shoes, bracing, and stretching. Stretching exercises provided to be done on a daily basis.   Prescription for meloxicam provided and sent to pharmacy. Previous Cr and BUN within normal range.  Patient requesting injection today. Procedure note below.   PF brace dispensed.  Follow-up 6 weeks or sooner if any problems arise. In the meantime, encouraged to call the office with any questions, concerns, change in symptoms.   Procedure:  Discussed etiology, pathology, conservative vs. surgical therapies. At this time a plantar fascial injection was  recommended.  The patient agreed and a sterile skin prep was applied.  An injection consisting of  1cc dexamethasone 0.5 cc kenalog and 1cc marcaine mixture was infiltrated at the point of maximal tenderness on the right Heel.  Bandaid applied. The patient tolerated this well and was given instructions for aftercare.    Louann Sjogren, DPM

## 2023-01-16 NOTE — Addendum Note (Signed)
Addended by: Louann Sjogren R on: 01/16/2023 08:30 AM   Modules accepted: Level of Service

## 2023-02-12 ENCOUNTER — Ambulatory Visit: Payer: BC Managed Care – PPO | Admitting: Internal Medicine

## 2023-02-13 ENCOUNTER — Ambulatory Visit
Admission: RE | Admit: 2023-02-13 | Discharge: 2023-02-13 | Disposition: A | Discharge status: Home / Self Care | Payer: BC Managed Care – PPO | Source: Ambulatory Visit | Attending: Family Medicine | Admitting: Family Medicine

## 2023-02-13 DIAGNOSIS — Z1231 Encounter for screening mammogram for malignant neoplasm of breast: Secondary | ICD-10-CM

## 2023-02-27 ENCOUNTER — Encounter: Payer: Self-pay | Admitting: Podiatry

## 2023-02-27 ENCOUNTER — Ambulatory Visit (INDEPENDENT_AMBULATORY_CARE_PROVIDER_SITE_OTHER): Payer: BC Managed Care – PPO | Admitting: Podiatry

## 2023-02-27 DIAGNOSIS — M722 Plantar fascial fibromatosis: Secondary | ICD-10-CM

## 2023-02-27 MED ORDER — TRIAMCINOLONE ACETONIDE 10 MG/ML IJ SUSP
2.5000 mg | Freq: Once | INTRAMUSCULAR | Status: AC
Start: 2023-02-27 — End: 2023-02-27
  Administered 2023-02-27: 2.5 mg via INTRA_ARTICULAR

## 2023-02-27 NOTE — Progress Notes (Signed)
  Subjective:  Patient ID: Jeanette Morales, female    DOB: [birth year],   MRN: [id number]  Chief Complaint  Patient presents with   Plantar Fasciitis    Follow up on right foot pain not better last visit 01/16/2023 had relief for two weeks before pain returned.    42 y.o. female presents for follow-up of left heel pain. Relates injection helped for about two weeks then pain came back.  Has been stretching wearing support and massage. Has tried dry needling and other modalities. Relates the brace was helping but stretched out and broke and in need of another . Denies any other pedal complaints. Denies n/v/f/c.   Past Medical History:  Diagnosis Date   Hypertension    Palpitations     Objective:  Physical Exam: Vascular: DP/PT pulses 2/4 bilateral. CFT <3 seconds. Normal hair growth on digits. No edema.  Skin. No lacerations or abrasions bilateral feet.  Musculoskeletal: MMT 5/5 bilateral lower extremities in DF, PF, Inversion and Eversion. Deceased ROM in DF of ankle joint. Tender to the medial calcaneal tubercle right . No pain with achilles, PT or arch. No pain with calcaneal squeeze.  Neurological: Sensation intact to light touch.   Assessment:   1. Plantar fasciitis of right foot       Plan:  Patient was evaluated and treated and all questions answered. Discussed plantar fasciitis with patient.  X-rays reviewed and discussed with patient. No acute fractures or dislocations noted. Mild spurring noted at inferior calcaneus.  Discussed treatment options including, ice, NSAIDS, supportive shoes, bracing, and stretching.  Continue PT and anti-inflammtories as needed.  Patient requesting injection today. Procedure note below.   PF brace dispensed. Previous one broken and no longer effective Follow-up 6 weeks or sooner if any problems arise. In the meantime, encouraged to call the office with any questions, concerns, change in symptoms.   Procedure:  Discussed etiology, pathology,  conservative vs. surgical therapies. At this time a plantar fascial injection was recommended.  The patient agreed and a sterile skin prep was applied.  An injection consisting of  1 cc kenalog and 1cc marcaine mixture was infiltrated at the point of maximal tenderness on the right Heel.  Bandaid applied. The patient tolerated this well and was given instructions for aftercare.    Louann Sjogren, DPM

## 2023-04-10 ENCOUNTER — Ambulatory Visit (INDEPENDENT_AMBULATORY_CARE_PROVIDER_SITE_OTHER): Payer: BC Managed Care – PPO | Admitting: Podiatry

## 2023-04-10 ENCOUNTER — Encounter: Payer: Self-pay | Admitting: Podiatry

## 2023-04-10 DIAGNOSIS — M722 Plantar fascial fibromatosis: Secondary | ICD-10-CM | POA: Diagnosis not present

## 2023-04-10 NOTE — Progress Notes (Signed)
  Subjective:  Patient ID: Jeanette Morales, female    DOB: 08/12/1980,   MRN: [id number]  Chief Complaint  Patient presents with   Plantar Fasciitis    Pt presents for a follow up of plantar fascitis of right foot stating she feels the same. "Some days are better than the other"    42 y.o. female presents for follow-up of left heel pain. Relates injection helped for about two weeks then pain came back.  Has been stretching wearing support and massage. Has tried dry needling and other modalities. Relates she is ready for next steps as this has been going on for a long time with no improvement.  . Denies any other pedal complaints. Denies n/v/f/c.   Past Medical History:  Diagnosis Date   Hypertension    Palpitations     Objective:  Physical Exam: Vascular: DP/PT pulses 2/4 bilateral. CFT <3 seconds. Normal hair growth on digits. No edema.  Skin. No lacerations or abrasions bilateral feet.  Musculoskeletal: MMT 5/5 bilateral lower extremities in DF, PF, Inversion and Eversion. Deceased ROM in DF of ankle joint. Tender to the medial calcaneal tubercle right . No pain with achilles, PT or arch. No pain with calcaneal squeeze.  Neurological: Sensation intact to light touch.   Assessment:   1. Plantar fasciitis of right foot        Plan:  Patient was evaluated and treated and all questions answered. Discussed plantar fasciitis with patient.  X-rays reviewed and discussed with patient. No acute fractures or dislocations noted. Mild spurring noted at inferior calcaneus.  Discussed treatment options including, ice, NSAIDS, supportive shoes, bracing, and stretching.  Continue PT and anti-inflammtories as needed.  Discussed getting MRI for possible surgical planning . Ordered  CAM boot dispensed to offload foot for time being given no improvement.  Follow-up after MRI.     Louann Sjogren, DPM

## 2023-04-17 ENCOUNTER — Encounter: Payer: Self-pay | Admitting: Podiatry

## 2023-04-21 ENCOUNTER — Ambulatory Visit
Admission: RE | Admit: 2023-04-21 | Discharge: 2023-04-21 | Disposition: A | Discharge status: Home / Self Care | Payer: BC Managed Care – PPO | Source: Ambulatory Visit | Attending: Podiatry

## 2023-04-21 DIAGNOSIS — M722 Plantar fascial fibromatosis: Secondary | ICD-10-CM

## 2023-05-15 ENCOUNTER — Ambulatory Visit (INDEPENDENT_AMBULATORY_CARE_PROVIDER_SITE_OTHER): Payer: BC Managed Care – PPO | Admitting: Urology

## 2023-05-15 ENCOUNTER — Encounter: Payer: Self-pay | Admitting: Urology

## 2023-05-15 VITALS — BP 111/79 | HR 87 | Ht 63.0 in | Wt 200.0 lb

## 2023-05-15 DIAGNOSIS — Z87442 Personal history of urinary calculi: Secondary | ICD-10-CM

## 2023-05-15 DIAGNOSIS — N200 Calculus of kidney: Secondary | ICD-10-CM

## 2023-05-15 DIAGNOSIS — R109 Unspecified abdominal pain: Secondary | ICD-10-CM | POA: Diagnosis not present

## 2023-05-15 NOTE — Progress Notes (Signed)
   Assessment: 1. Flank pain   2. Nephrolithiasis      Plan: Today had a long discussion with the patient regarding her history of kidney stones and recent onset of left-sided flank pain.  Will obtain CT stone study for further evaluation.  Will contact patient with results and recommendations.  Chief Complaint: flank pain  History of Present Illness:  Jeanette Morales is a 43 y.o. female who is seen in consultation from Toribio Jerel MATSU, MD for evaluation of flank pain with history of nephrolithiasis.  Patient has a history of nephrolithiasis having passed 2 stones previously.  This has been approximately 10 or more years ago.  Over the last several weeks she has developed left-sided intermittent pain that reminds her of her prior kidney stone discomfort.  She denies any nausea vomiting or lower urinary tract symptoms.  She has had no gross hematuria.  Urinalysis today is entirely negative   Past Medical History:  Past Medical History:  Diagnosis Date   Hypertension    Palpitations     Past Surgical History:  Past Surgical History:  Procedure Laterality Date   BREAST BIOPSY Right 2015   FOOT SURGERY Left 2019   NO PAST SURGERIES      Allergies:  Allergies  Allergen Reactions   Lexapro [Escitalopram Oxalate]     agitation    Family History:  Family History  Problem Relation Age of Onset   Diabetes Mother    Heart disease Father        stent placement x's 4   Heart attack Maternal Grandmother    Heart disease Paternal Grandmother        stent placment   Kidney Stones Daughter    Breast cancer Maternal Aunt     Social History:  Social History   Tobacco Use   Smoking status: Never   Smokeless tobacco: Never  Vaping Use   Vaping status: Never Used  Substance Use Topics   Alcohol use: No    Alcohol/week: 0.0 standard drinks of alcohol   Drug use: No    Review of symptoms:  Constitutional:  Negative for unexplained weight loss, night sweats, fever,  chills ENT:  Negative for nose bleeds, sinus pain, painful swallowing CV:  Negative for chest pain, shortness of breath, exercise intolerance, palpitations, loss of consciousness Resp:  Negative for cough, wheezing, shortness of breath GI:  Negative for nausea, vomiting, diarrhea, bloody stools GU:  Positives noted in HPI; otherwise negative for gross hematuria, dysuria, urinary incontinence Neuro:  Negative for seizures, poor balance, limb weakness, slurred speech Psych:  Negative for lack of energy, depression, anxiety Endocrine:  Negative for polydipsia, polyuria, symptoms of hypoglycemia (dizziness, hunger, sweating) Hematologic:  Negative for anemia, purpura, petechia, prolonged or excessive bleeding, use of anticoagulants  Allergic:  Negative for difficulty breathing or choking as a result of exposure to anything; no shellfish allergy; no allergic response (rash/itch) to materials, foods  Physical exam: BP 111/79   Pulse 87   Ht 5' 3 (1.6 m)   Wt 200 lb (90.7 kg)   BMI 35.43 kg/m  GENERAL APPEARANCE:  Well appearing, well developed, well nourished, NAD   Results: UA negative

## 2023-05-17 LAB — URINALYSIS, ROUTINE W REFLEX MICROSCOPIC
Bilirubin, UA: NEGATIVE
Glucose, UA: NEGATIVE
Ketones, UA: NEGATIVE
Leukocytes,UA: NEGATIVE
Nitrite, UA: NEGATIVE
Protein,UA: NEGATIVE
RBC, UA: NEGATIVE
Specific Gravity, UA: 1.01 (ref 1.005–1.030)
Urobilinogen, Ur: 1 mg/dL (ref 0.2–1.0)
pH, UA: 6.5 (ref 5.0–7.5)

## 2023-05-17 NOTE — Addendum Note (Signed)
Addended by: Carolin Coy on: 05/17/2023 10:53 AM   Modules accepted: Orders

## 2023-05-22 ENCOUNTER — Ambulatory Visit (HOSPITAL_COMMUNITY): Payer: BC Managed Care – PPO

## 2023-05-23 ENCOUNTER — Encounter: Payer: Self-pay | Admitting: Podiatry

## 2023-05-23 ENCOUNTER — Ambulatory Visit (INDEPENDENT_AMBULATORY_CARE_PROVIDER_SITE_OTHER): Payer: BC Managed Care – PPO | Admitting: Podiatry

## 2023-05-23 DIAGNOSIS — M722 Plantar fascial fibromatosis: Secondary | ICD-10-CM | POA: Diagnosis not present

## 2023-05-23 NOTE — Progress Notes (Signed)
  Subjective:  Patient ID: Jeanette Morales, female    DOB: 12/31/1980,   MRN: [id number]  No chief complaint on file.   43 y.o. female presents for follow-up of right heel pain and to review MRI.  Has been in the boot relates helps some so has been in and out of it.    Has tried dry needling and other modalities. Relates she is ready for surgery as this has been going on for a while and has tried everything she can conservatively. . Denies any other pedal complaints. Denies n/v/f/c.   Past Medical History:  Diagnosis Date   Hypertension    Palpitations     Objective:  Physical Exam: Vascular: DP/PT pulses 2/4 bilateral. CFT <3 seconds. Normal hair growth on digits. No edema.  Skin. No lacerations or abrasions bilateral feet.  Musculoskeletal: MMT 5/5 bilateral lower extremities in DF, PF, Inversion and Eversion. Deceased ROM in DF of ankle joint. Tender to the medial calcaneal tubercle right . No pain with achilles, PT or arch. No pain with calcaneal squeeze.  Neurological: Sensation intact to light touch.    MRI right foot  IMPRESSION: 1. Acute on chronic plantar fasciitis. 2. Longitudinal split tears of the peroneal longus and brevis tendons with tenosynovitis of the common peroneal tendon sheath. 3. Mild tenosynovitis of the posterior tibialis tendon. Assessment:   1. Plantar fasciitis of right foot         Plan:  Patient was evaluated and treated and all questions answered. Discussed plantar fasciitis with patient.  X-rays reviewed and discussed with patient. No acute fractures or dislocations noted. Mild spurring noted at inferior calcaneus.  Discussed treatment options including, ice, NSAIDS, supportive shoes, bracing, and stretching.  Continue stretching.  MRI reviewed and confirms plantar fasciitis. Denies any pain in areas of tenosynolviitis on MRI.  Continue CAM boot as needed.  Discussed surgical treatment and plantar fascia release in detail with patient as well  as perioperative course.  -Informed surgical risk consent was reviewed and read aloud to the patient.  I reviewed the films.  I have discussed my findings with the patient in great detail.  I have discussed all risks including but not limited to infection, stiffness, scarring, limp, disability, deformity, damage to blood vessels and nerves, numbness, poor healing, need for braces, arthritis, chronic pain, amputation, death.  All benefits and realistic expectations discussed in great detail.  I have made no promises as to the outcome.  I have provided realistic expectations.  I have offered the patient a 2nd opinion, which they have declined and assured me they preferred to proceed despite the risks. Plan for surgery 2/25    Louann Sjogren, DPM

## 2023-06-01 ENCOUNTER — Telehealth: Payer: Self-pay | Admitting: Podiatry

## 2023-06-01 NOTE — Telephone Encounter (Signed)
DOS-06/26/23  PLANTAR FASCIA RELEASE [id number]  CARE 1ST BLUE CHOICE EFFECTIVE DATE- 01/12/22  DEDUCTIBLE-$3300.00 WITH REMAINING $3,133.89  OOP-$6850.00 WITH REMAINING $1,610.96  COINSURANCE- 15%  SPOKE WITH ANGELA J FROM [facility] AND SHE STATED THAT PRIOR AUTH IS NOT REQUIRED FOR CPT CODE 04540  Call ref #: Etta Quill 06/01/23 @ 10:04 AM EST

## 2023-06-16 IMAGING — MG MM DIGITAL SCREENING BILAT W/ TOMO AND CAD
6 of 10 series · 6 of 30 positions shown · non-contrast
Comparison: Previous exam(s).

CLINICAL DATA: Screening.

EXAM:
DIGITAL SCREENING BILATERAL MAMMOGRAM WITH TOMOSYNTHESIS AND CAD
TECHNIQUE: Bilateral screening digital craniocaudal and mediolateral oblique
mammograms were obtained. Bilateral screening digital breast
tomosynthesis was performed. The images were evaluated with
computer-aided detection.

[R MLO synth-2D]
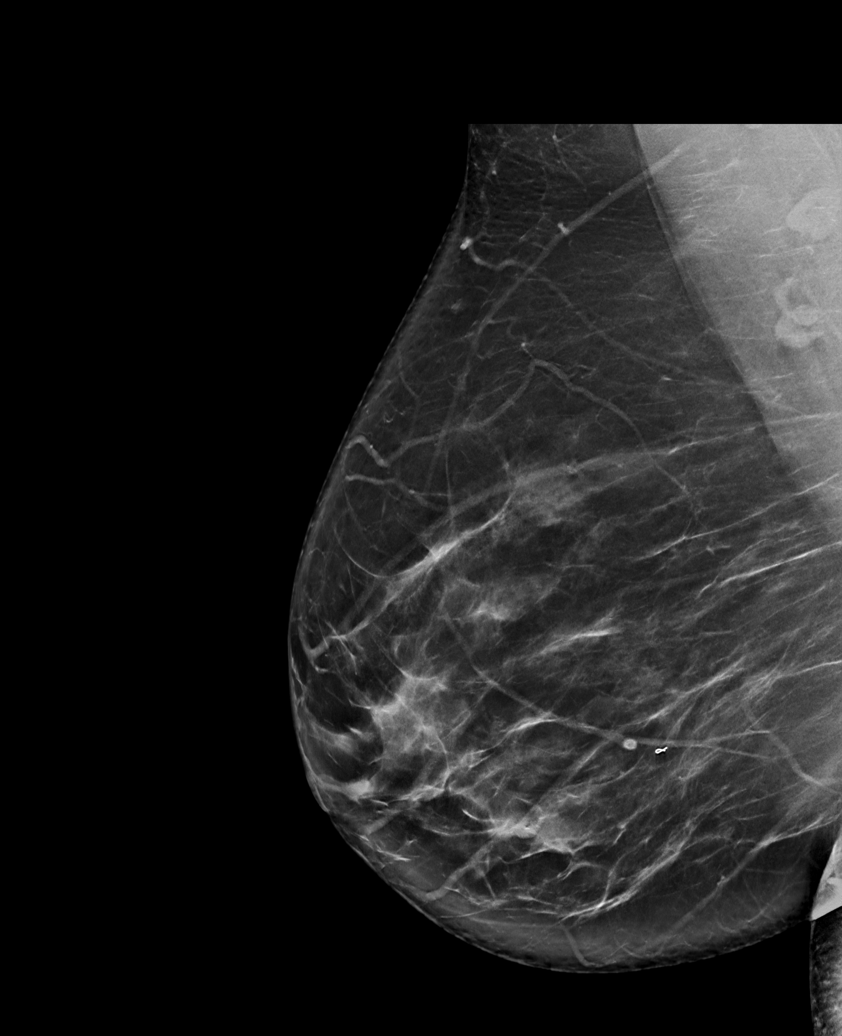

[R CC synth-2D (1 of 2)]
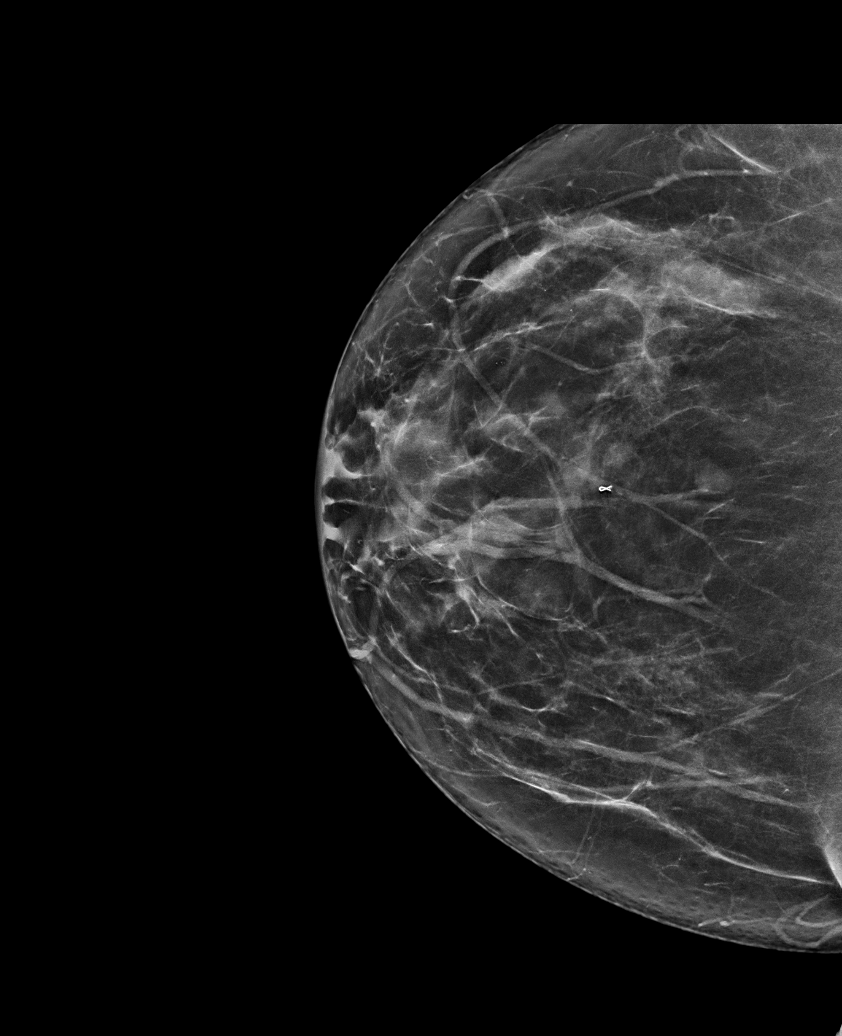

[L MLO synth-2D]
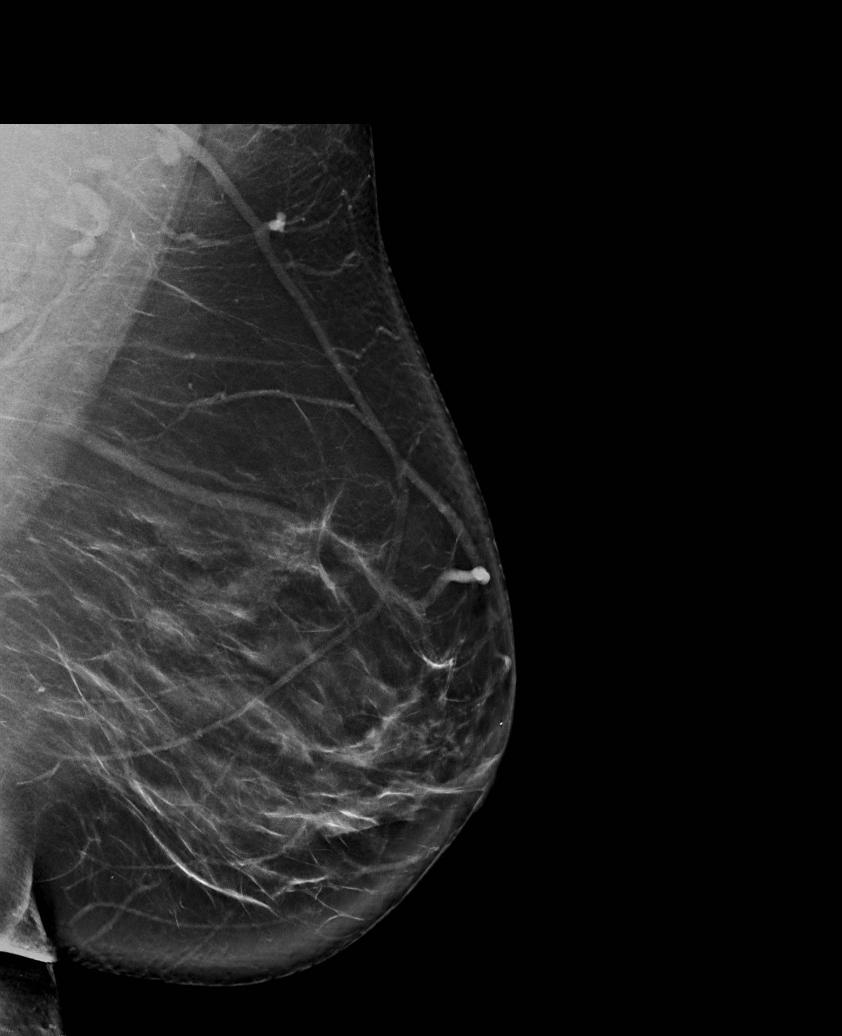

[L CC synth-2D]
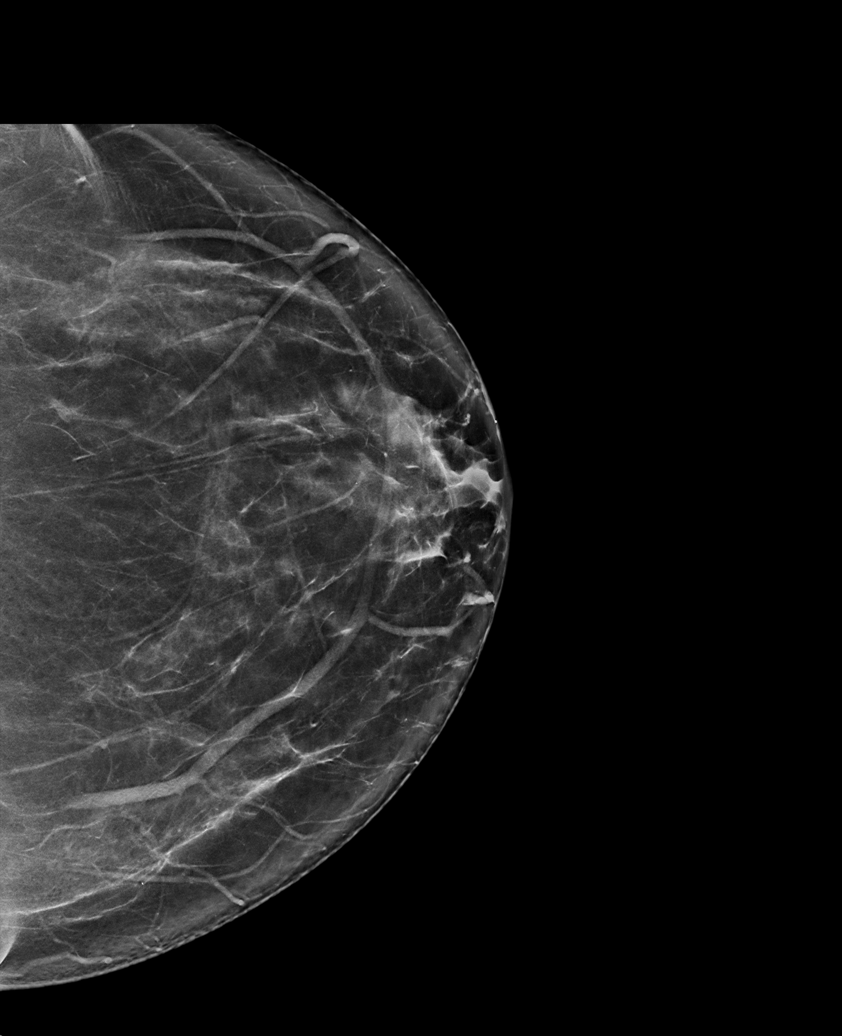

[R CC synth-2D (2 of 2)]
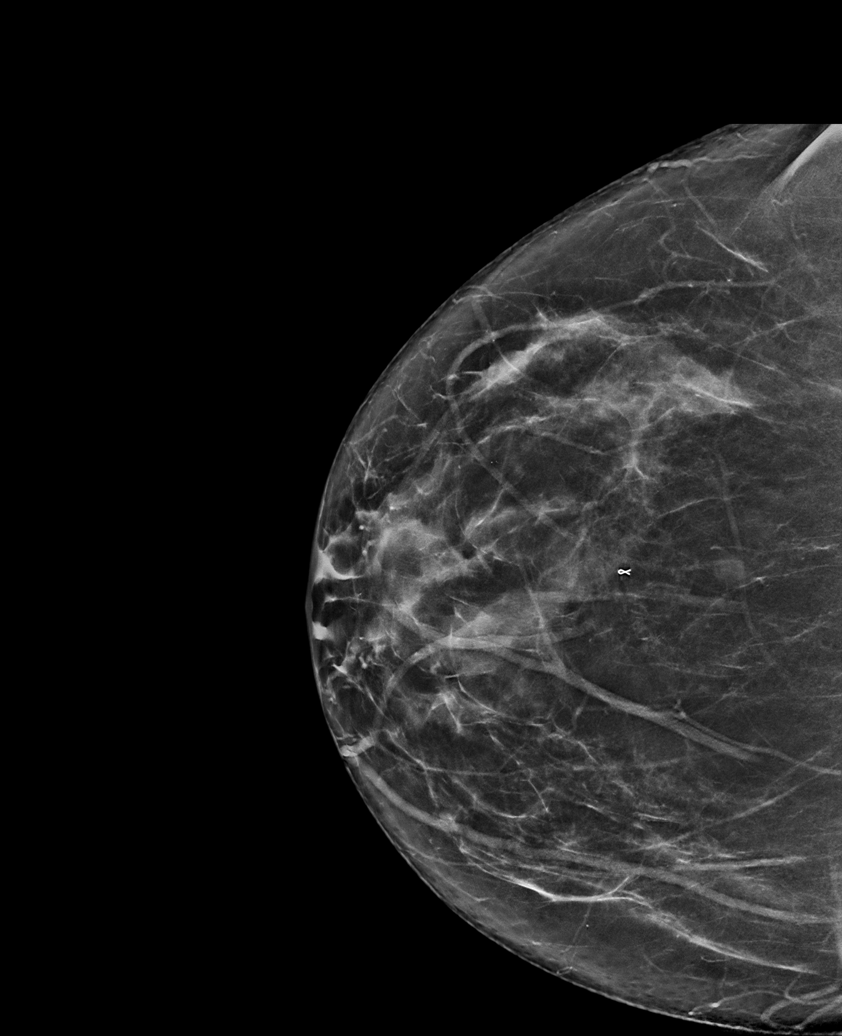

[L MLO tomo · tomo slice 50/99.0]
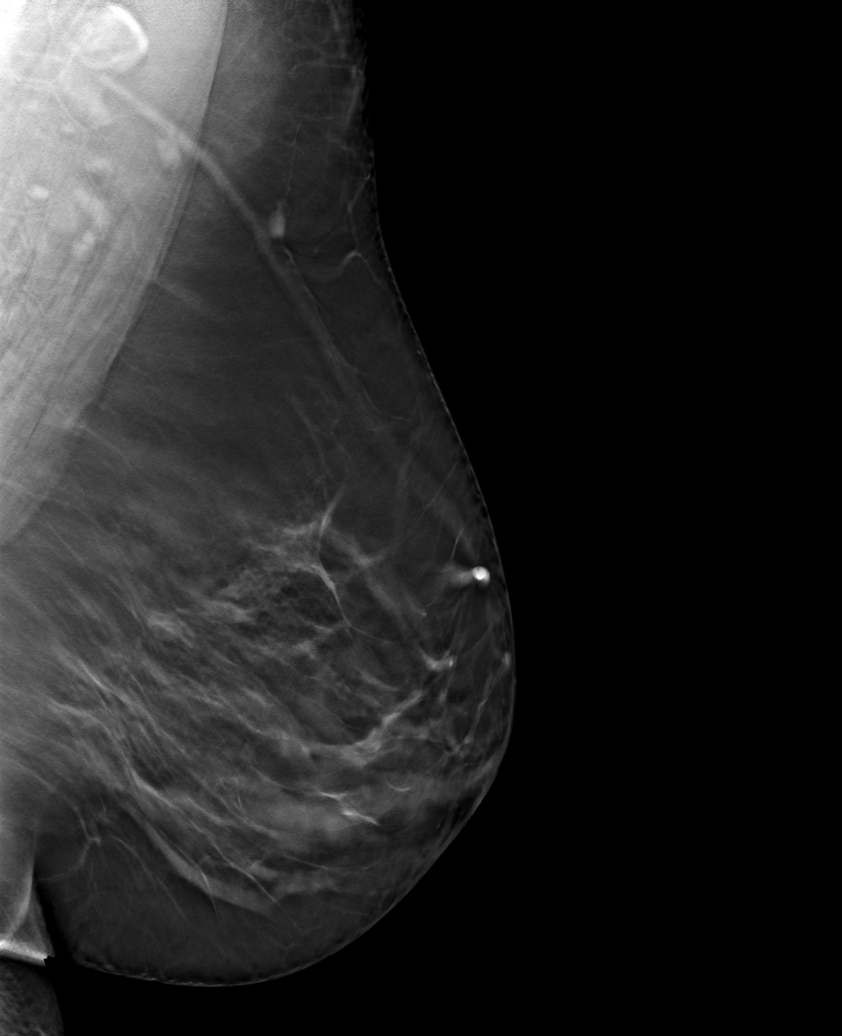

[6 of 30 positions shown; findings below may reference images not displayed]

ACR Breast Density Category c: The breast tissue is heterogeneously
dense, which may obscure small masses.
FINDINGS: There are no findings suspicious for malignancy.
IMPRESSION: No mammographic evidence of malignancy. A result letter of this
screening mammogram will be mailed directly to the patient.

RECOMMENDATION:
Screening mammogram in one year. (Code:Q3-W-BC3)

BI-RADS CATEGORY  1: Negative.

## 2023-06-22 ENCOUNTER — Telehealth: Payer: Self-pay | Admitting: Podiatry

## 2023-06-22 NOTE — Telephone Encounter (Signed)
 Completed STD paperwork from [facility] for patient -- faxed to [phone] ....   Called patient and advised same .Jeanette Morales...     J. Abbott -- 06/22/2023

## 2023-06-26 ENCOUNTER — Other Ambulatory Visit: Payer: Self-pay | Admitting: Podiatry

## 2023-06-26 DIAGNOSIS — M722 Plantar fascial fibromatosis: Secondary | ICD-10-CM | POA: Diagnosis not present

## 2023-06-26 MED ORDER — ONDANSETRON HCL 4 MG PO TABS: 4.0000 mg | ORAL_TABLET | Freq: Three times a day (TID) | ORAL | 0 refills | Status: AC | PRN

## 2023-06-26 MED ORDER — OXYCODONE-ACETAMINOPHEN 5-325 MG PO TABS
1.0000 | ORAL_TABLET | ORAL | 0 refills | Status: AC | PRN
Start: 2023-06-26 — End: 2023-07-03

## 2023-07-04 ENCOUNTER — Ambulatory Visit (INDEPENDENT_AMBULATORY_CARE_PROVIDER_SITE_OTHER): Payer: BLUE CROSS/BLUE SHIELD | Admitting: Podiatry

## 2023-07-04 ENCOUNTER — Ambulatory Visit (INDEPENDENT_AMBULATORY_CARE_PROVIDER_SITE_OTHER)

## 2023-07-04 DIAGNOSIS — M722 Plantar fascial fibromatosis: Secondary | ICD-10-CM

## 2023-07-04 NOTE — Progress Notes (Signed)
  Subjective:  Patient ID: Jeanette Morales, female    DOB: [birth year],  MRN: [id number]  No chief complaint on file.   DOS: 06/26/23  Procedure: Right plantar fasciotomy   43 y.o. female returns for POV#1. Relates doing well and managing pain.   Review of Systems: Negative except as noted in the HPI. Denies N/V/F/Ch.  Past Medical History:  Diagnosis Date   Hypertension    Palpitations     Current Outpatient Medications:    DULoxetine (CYMBALTA) 30 MG capsule, Take 30 mg by mouth daily., Disp: , Rfl:    JUNEL 1/20 1-20 MG-MCG tablet, Take 1 tablet by mouth daily., Disp: 84 tablet, Rfl: 4   losartan-hydrochlorothiazide (HYZAAR) 50-12.5 MG tablet, Take 1 tablet by mouth daily., Disp: 90 tablet, Rfl: 3   meloxicam (MOBIC) 15 MG tablet, Take 1 tablet (15 mg total) by mouth daily. (Patient not taking: Reported on 05/15/2023), Disp: 30 tablet, Rfl: 0   methylPREDNISolone (MEDROL DOSEPAK) 4 MG TBPK tablet, Take as directed (Patient not taking: Reported on 05/15/2023), Disp: 21 tablet, Rfl: 0   ondansetron (ZOFRAN) 4 MG tablet, Take 1 tablet (4 mg total) by mouth every 8 (eight) hours as needed for nausea or vomiting., Disp: 20 tablet, Rfl: 0   UNABLE TO FIND, Ozempic compound-weekly, Disp: , Rfl:   Social History   Tobacco Use  Smoking Status Never  Smokeless Tobacco Never    Allergies  Allergen Reactions   Lexapro [Escitalopram Oxalate]     agitation   Objective:  There were no vitals filed for this visit. There is no height or weight on file to calculate BMI. Constitutional Well developed. Well nourished.  Vascular Foot warm and well perfused. Capillary refill normal to all digits.   Neurologic Normal speech. Oriented to person, place, and time. Epicritic sensation to light touch grossly present bilaterally.  Dermatologic Skin healing well without signs of infection. Skin edges well coapted without signs of infection.  Orthopedic: Tenderness to palpation noted about the  surgical site.   Radiographs: No changes in osseous structure  Assessment:   1. Plantar fasciitis of right foot    Plan:  Patient was evaluated and treated and all questions answered.  S/p foot surgery right -Progressing as expected post-operatively. -WB Status: WBAT in surgical shoe -Sutures: intact. -Medications: n/a -Foot redressed.  Return in 2 weeks for suture removal.   No follow-ups on file.

## 2023-07-18 ENCOUNTER — Encounter: Payer: Self-pay | Admitting: Podiatry

## 2023-07-18 ENCOUNTER — Ambulatory Visit (INDEPENDENT_AMBULATORY_CARE_PROVIDER_SITE_OTHER): Payer: BC Managed Care – PPO | Admitting: Podiatry

## 2023-07-18 DIAGNOSIS — M722 Plantar fascial fibromatosis: Secondary | ICD-10-CM

## 2023-07-18 NOTE — Progress Notes (Signed)
  Subjective:  Patient ID: Jeanette Morales, female    DOB: February 24, 1981,  MRN: [id number]  No chief complaint on file.   DOS: 06/26/23  Procedure: Right plantar fasciotomy   43 y.o. female returns for POV#2. Relates doing well and managing pain.   Review of Systems: Negative except as noted in the HPI. Denies N/V/F/Ch.  Past Medical History:  Diagnosis Date   Hypertension    Palpitations     Current Outpatient Medications:    DULoxetine (CYMBALTA) 30 MG capsule, Take 30 mg by mouth daily., Disp: , Rfl:    JUNEL 1/20 1-20 MG-MCG tablet, Take 1 tablet by mouth daily., Disp: 84 tablet, Rfl: 4   losartan-hydrochlorothiazide (HYZAAR) 50-12.5 MG tablet, Take 1 tablet by mouth daily., Disp: 90 tablet, Rfl: 3   meloxicam (MOBIC) 15 MG tablet, Take 1 tablet (15 mg total) by mouth daily. (Patient not taking: Reported on 05/15/2023), Disp: 30 tablet, Rfl: 0   methylPREDNISolone (MEDROL DOSEPAK) 4 MG TBPK tablet, Take as directed (Patient not taking: Reported on 05/15/2023), Disp: 21 tablet, Rfl: 0   ondansetron (ZOFRAN) 4 MG tablet, Take 1 tablet (4 mg total) by mouth every 8 (eight) hours as needed for nausea or vomiting., Disp: 20 tablet, Rfl: 0   UNABLE TO FIND, Ozempic compound-weekly, Disp: , Rfl:   Social History   Tobacco Use  Smoking Status Never  Smokeless Tobacco Never    Allergies  Allergen Reactions   Lexapro [Escitalopram Oxalate]     agitation   Objective:  There were no vitals filed for this visit. There is no height or weight on file to calculate BMI. Constitutional Well developed. Well nourished.  Vascular Foot warm and well perfused. Capillary refill normal to all digits.   Neurologic Normal speech. Oriented to person, place, and time. Epicritic sensation to light touch grossly present bilaterally.  Dermatologic Skin healing well without signs of infection. Skin edges well coapted without signs of infection.  Orthopedic: Tenderness to palpation noted about the  surgical site.   Radiographs: No changes in osseous structure  Assessment:   1. Plantar fasciitis of right foot     Plan:  Patient was evaluated and treated and all questions answered.  S/p foot surgery right -Progressing as expected post-operatively. -WB Status: WBAT in regular shoe -Sutures: removed today without incident.  -Medications: n/a -Foot redressed.  Return in 3 weeks for recheck  No follow-ups on file.

## 2023-08-08 ENCOUNTER — Encounter: Payer: Self-pay | Admitting: Podiatry

## 2023-08-08 ENCOUNTER — Ambulatory Visit (INDEPENDENT_AMBULATORY_CARE_PROVIDER_SITE_OTHER): Admitting: Podiatry

## 2023-08-08 DIAGNOSIS — M722 Plantar fascial fibromatosis: Secondary | ICD-10-CM

## 2023-08-08 NOTE — Progress Notes (Signed)
  Subjective:  Patient ID: Jeanette Morales, female    DOB: 08-28-1980,  MRN: [id number]  No chief complaint on file.   DOS: 06/26/23  Procedure: Right plantar fasciotomy   43 y.o. female returns for POV#3. Relates doing well and managing pain.   Review of Systems: Negative except as noted in the HPI. Denies N/V/F/Ch.  Past Medical History:  Diagnosis Date   Hypertension    Palpitations     Current Outpatient Medications:    DULoxetine (CYMBALTA) 30 MG capsule, Take 30 mg by mouth daily., Disp: , Rfl:    JUNEL 1/20 1-20 MG-MCG tablet, Take 1 tablet by mouth daily., Disp: 84 tablet, Rfl: 4   losartan-hydrochlorothiazide (HYZAAR) 50-12.5 MG tablet, Take 1 tablet by mouth daily., Disp: 90 tablet, Rfl: 3   meloxicam (MOBIC) 15 MG tablet, Take 1 tablet (15 mg total) by mouth daily. (Patient not taking: Reported on 05/15/2023), Disp: 30 tablet, Rfl: 0   methylPREDNISolone (MEDROL DOSEPAK) 4 MG TBPK tablet, Take as directed (Patient not taking: Reported on 05/15/2023), Disp: 21 tablet, Rfl: 0   ondansetron (ZOFRAN) 4 MG tablet, Take 1 tablet (4 mg total) by mouth every 8 (eight) hours as needed for nausea or vomiting., Disp: 20 tablet, Rfl: 0   UNABLE TO FIND, Ozempic compound-weekly, Disp: , Rfl:   Social History   Tobacco Use  Smoking Status Never  Smokeless Tobacco Never    Allergies  Allergen Reactions   Lexapro [Escitalopram Oxalate]     agitation   Objective:  There were no vitals filed for this visit. There is no height or weight on file to calculate BMI. Constitutional Well developed. Well nourished.  Vascular Foot warm and well perfused. Capillary refill normal to all digits.   Neurologic Normal speech. Oriented to person, place, and time. Epicritic sensation to light touch grossly present bilaterally.  Dermatologic Skin healing well without signs of infection. Skin edges well coapted without signs of infection.  Orthopedic: Tenderness to palpation noted about the  surgical site.   Radiographs: No changes in osseous structure  Assessment:   1. Plantar fasciitis of right foot     Plan:  Patient was evaluated and treated and all questions answered.  S/p foot surgery right -Progressing as expected post-operatively. -WB Status: WBAT in regular shoe -Use boot as needed  -Amb ref to PT to aid in progress.  -Medications: n/a   Return in 6 weeks for recheck  No follow-ups on file.

## 2023-08-12 ENCOUNTER — Encounter: Payer: Self-pay | Admitting: Podiatry

## 2023-08-15 NOTE — Telephone Encounter (Signed)
 I spoke to Dr. Alvah Auerbach and she wants Jeanette Morales to wait and return to work after her next appointment on 10/01/2023. I called and informed Jeanette Morales of this information. I also completed the form from [place] and faxed the information to them.

## 2023-09-04 ENCOUNTER — Other Ambulatory Visit: Payer: Self-pay

## 2023-09-04 ENCOUNTER — Encounter (HOSPITAL_COMMUNITY): Payer: Self-pay

## 2023-09-04 ENCOUNTER — Ambulatory Visit (HOSPITAL_COMMUNITY): Attending: Podiatry

## 2023-09-04 DIAGNOSIS — M722 Plantar fascial fibromatosis: Secondary | ICD-10-CM | POA: Insufficient documentation

## 2023-09-04 DIAGNOSIS — Z7409 Other reduced mobility: Secondary | ICD-10-CM | POA: Diagnosis present

## 2023-09-04 DIAGNOSIS — M79671 Pain in right foot: Secondary | ICD-10-CM | POA: Diagnosis present

## 2023-09-04 DIAGNOSIS — M79661 Pain in right lower leg: Secondary | ICD-10-CM | POA: Insufficient documentation

## 2023-09-04 NOTE — Therapy (Signed)
 OUTPATIENT PHYSICAL THERAPY LOWER EXTREMITY EVALUATION   Patient Name: Jeanette Morales MRN: [id number] DOB:Nov 11, 1980, 43 y.o., female Today's Date: 09/04/2023  END OF SESSION:  PT End of Session - 09/04/23 1220     Visit Number 1    Date for PT Re-Evaluation 10/02/23    Authorization Type BLUE [employer]    Authorization Time Period seeking auth    Progress Note Due on Visit 10    PT Start Time 1015    PT Stop Time 1100    PT Time Calculation (min) 45 min    Activity Tolerance Patient tolerated treatment well;Patient limited by pain    Behavior During Therapy [facility] for tasks assessed/performed             Past Medical History:  Diagnosis Date   Hypertension    Palpitations    Past Surgical History:  Procedure Laterality Date   BREAST BIOPSY Right 2015   FOOT SURGERY Left 2019   NO PAST SURGERIES     Patient Active Problem List   Diagnosis Date Noted   ASCUS of cervix with negative high risk HPV 01/16/2023   Boil of vulva 11/14/2019   Encounter for gynecological examination with Papanicolaou smear of cervix 10/27/2019   Encounter for surveillance of contraceptive pills 10/27/2019   Hypertension 10/27/2019   Stress incontinence 09/24/2019   Lichen sclerosus 09/24/2019   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   OAB (overactive bladder) 09/24/2019   Elevated BP without diagnosis of hypertension 09/24/2019   Vulvar irritation 09/11/2019    PCP: Leesa Pulling, MD   REFERRING PROVIDER: Jennefer Moats, DPM  REFERRING DIAG: M72.2 (ICD-10-CM) - Plantar fasciitis of right foot  THERAPY DIAG:  Pain in right foot  Pain in right lower leg  Impaired functional mobility, balance, gait, and endurance  Rationale for Evaluation and Treatment: Rehabilitation  ONSET DATE: 06/26/23  SUBJECTIVE:   SUBJECTIVE STATEMENT: Pt states she has been dealing with Right heel pain for 10 years, recently when through a partial release of plantar fascia. Pt states it helped  a little bit but. Pt states she can not stand or walk long on it with out it hurting or having to sit down. Pt tried therapy 2 times before the surgery. 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 Pt states she literally can barely make it back to the car after work, cries and hobbles into home.  PERTINENT HISTORY: -Left foot surgery 7 years ago, sesamoid removal -lost 65lbs in last year  PAIN:  Are you having pain? Yes: NPRS scale: 5/10 Pain location: arch of right foot,  Pain description: burning, sharp, shooting Aggravating factors: walking, standing Relieving factors: prednisone, ice  PRECAUTIONS: None  RED FLAGS: None   WEIGHT BEARING RESTRICTIONS: No  FALLS:  Has patient fallen in last 6 months? No  LIVING ENVIRONMENT: Lives with: lives with their family Lives in: House/apartment Stairs: Yes: External: 4 steps; on right going up Has following equipment at home: Crutches, on occassion  OCCUPATION: [employer], out on leave  PLOF: Independent with basic ADLs, Needs assistance with ADLs, and hired someone to help clean  PATIENT GOALS: walk and stand without pain in right foot, get back to work  NEXT MD VISIT: Early June  OBJECTIVE:  Note: Objective measures were completed at Evaluation unless otherwise noted.  DIAGNOSTIC FINDINGS: CLINICAL DATA:  Heel pain, chronic surgical planning for plantar fasciitis   EXAM: MR OF THE  RIGHT HEEL WITHOUT CONTRAST   TECHNIQUE: Multiplanar, multisequence MR imaging of the right foot was performed. No intravenous contrast was administered.   COMPARISON:  Right foot radiographs dated January 16, 2023.   FINDINGS: Bones/Joint/Cartilage   No fracture or dislocation. Normal alignment. No joint effusion. No marrow signal abnormality.   Ligaments   The components of the lateral collateral ligament, deltoid ligament, and syndesmotic ligaments appear grossly intact. Lisfranc ligament is  intact.   Muscles and Tendons   There is a longitudinal split tear of the peroneal longus and brevis tendons extending from the retromalleolar level through the midfoot with tenosynovitis of the common peroneal tendon sheath. Mild tenosynovitis of the posterior tibialis tendon just below the level of the medial malleolus. Flexor and extensor compartment tendons are intact. Achilles tendon is intact. Muscles are normal.   Plantar fascia   Thickening of the central cord of the plantar fascia with surrounding edema. There is an inferior calcaneal enthesophyte with mild associated edema.   Soft tissue Mild subcutaneous edema at the plantar heel.  Muscles are normal.   IMPRESSION: 1. Acute on chronic plantar fasciitis. 2. Longitudinal split tears of the peroneal longus and brevis tendons with tenosynovitis of the common peroneal tendon sheath. 3. Mild tenosynovitis of the posterior tibialis tendon.  PATIENT SURVEYS:  LEFS 35/80  COGNITION: Overall cognitive status: Within functional limits for tasks assessed     SENSATION: WFL  EDEMA:  On occasion, not at this time  PALPATION: Pt demonstrates tenderness on medial and lateral arch, lateral compartment of lower leg and posterior tib region  LOWER EXTREMITY ROM:  Active ROM Right eval Left eval  Hip flexion    Hip extension    Hip abduction    Hip adduction    Hip internal rotation    Hip external rotation    Knee flexion    Knee extension    Ankle dorsiflexion 9 9  Ankle plantarflexion 55 63  Ankle inversion 40 50  Ankle eversion 28, some pain lateral compartment 20   (Blank rows = not tested)  LOWER EXTREMITY MMT:  MMT Right eval Left eval  Hip flexion    Hip extension    Hip abduction    Hip adduction    Hip internal rotation    Hip external rotation    Knee flexion    Knee extension    Ankle dorsiflexion 4- 5  Ankle plantarflexion 4-, pain on inside of arch and lateral compartment of leg  4-   Ankle inversion 4 5  Ankle eversion 4- 5   (Blank rows = not tested)  LOWER EXTREMITY SPECIAL TESTS:  Ankle special tests: Great toe extension test: negative  FUNCTIONAL TESTS:  2 minute walk test: 294 feet SLS R: 5.60s L: 26s  GAIT: Distance walked: 294 Assistive device utilized:  Pt reaching for handrail on wall several times for off weighting of RLE and None Level of assistance: Complete Independence Comments: Pt presents with increasingly antalgic gait pattern, decreased velocity, decreased stance time on RLE.  TREATMENT DATE:  09/04/2023  Evaluation: -ROM measured, Strength assessed, HEP prescribed, pt educated on prognosis, findings, and importance of HEP compliance if given.     PATIENT EDUCATION:  Education details: Pt was educated on findings of PT evaluation, prognosis, frequency of therapy visits and rationale, attendance policy, and HEP if given.   Person educated: Patient Education method: Explanation, Verbal cues, and Handouts Education comprehension: verbalized understanding, verbal cues required, and needs further education  HOME EXERCISE PROGRAM: Access Code: [email or web address] URL: [email or web address] Date: 09/04/2023 Prepared by: Armond Bertin  Exercises - Gastroc Stretch on Wall  - 1 x daily - 7 x weekly - 3 sets - 30 hold - Single Leg Stance  - 1 x daily - 7 x weekly - 3 sets - 10 reps - Towel Scrunches  - 1 x daily - 7 x weekly - 3 sets - 10 reps - Arch Lifting  - 1 x daily - 7 x weekly - 3 sets - 10 reps  ASSESSMENT:  CLINICAL IMPRESSION: Patient is a 43 y.o. female who was seen today for physical therapy evaluation and treatment for M72.2 (ICD-10-CM) - Plantar fasciitis of right foot.   Patient demonstrates antalgic gait, pain in right foot and lateral compartment of leg during AROM, decreased RLE strength, and  impaired balance on RLE. Patient also demonstrates difficulty with ambulation during today's session with decreased stride length and velocity noted due increasing amount of pain during . Patient also demonstrates point tenderness to R foot arch, posterior tib insertion and origin, as well as along peroneal musculature.  Patient would benefit from skilled physical therapy for decreased pain of RLE, increased endurance with ambulation, increased LE strength, and balance for improved gait quality, return to higher level of function with ADLs, and progress towards therapy goals.   OBJECTIVE IMPAIRMENTS: Abnormal gait, decreased activity tolerance, decreased balance, decreased endurance, decreased mobility, difficulty walking, decreased strength, and pain.   ACTIVITY LIMITATIONS: carrying, lifting, bending, standing, squatting, stairs, transfers, and locomotion level  PARTICIPATION LIMITATIONS: meal prep, cleaning, laundry, driving, shopping, community activity, occupation, and yard work  PERSONAL FACTORS: Past/current experiences, Time since onset of injury/illness/exacerbation, and 1 comorbidity: surgery already completed  are also affecting patient's functional outcome.   REHAB POTENTIAL: Fair 3 months since surgery  CLINICAL DECISION MAKING: Stable/uncomplicated  EVALUATION COMPLEXITY: Low   GOALS: Goals reviewed with patient? No  SHORT TERM GOALS: Target date: 09/25/23  Patient will demonstrate evidence of independence with individualized HEP and will report compliance for at least 3 days per week for optimized progression towards remaining therapy goals. Baseline:  Goal status: INITIAL  2.  Patient will report a decrease in pain level during community ambulation by at least 2 points for improved quality of life. Baseline: 5/10 Goal status: INITIAL     LONG TERM GOALS: Target date: 10/16/23  Pt will demonstrate a an increase of at least 9 points on the LEFS for improved  performance of community ambulation and ADL. Baseline: see objective Goal status: INITIAL  2.  Pt will improve 2 MWT by 140 feet in order to demonstrate improved functional ambulatory capacity in community setting.  Baseline: see objective Goal status: INITIAL  3.  Pt will demonstrate WFL pain free ROM (flexion and extension) in right ankle, for increased mobility and maximal efficiency of gait cycle during ambulation. Baseline: see objective Goal status: INITIAL  4.  Pt will demonstrate at least 4/5 MMT for right ankle extremity for increased strength during ADL and community ambulation. Baseline:  see objective Goal status: INITIAL  5.  Pt will improve R SLS by 10 seconds in order to improve balance and stability of RLE in comparison to LLE during functional activities. Baseline: see objective Goal status: INITIAL    PLAN:  PT FREQUENCY: 2x/week  PT DURATION: 6 weeks  PLANNED INTERVENTIONS: 97110-Therapeutic exercises, 97530- Therapeutic activity, 97112- Neuromuscular re-education, 97535- Self Care, 21308- Manual therapy, [phone]- Gait training, Patient/Family education, Balance training, Stair training, Joint mobilization, DME instructions, Cryotherapy, and Moist heat  PLAN FOR NEXT SESSION: consider SPC/AD training to increase endurance with community ambulation, manual therapy to R foot arch and peroneal tendons and posterior tib insertion, progress stretching and HEP to pt toleration   Armond Bertin, PT, DPT [facility] Office: [phone] 4:45 PM, 09/04/23   Managed Medicaid Authorization Request  Visit Dx Codes: L24.401 ; U27.253 ; Z74.09   Functional Tool Score: LEFS 35/80  For all possible CPT codes, reference the Planned Interventions line above.     Check all conditions that are expected to impact treatment: {Conditions expected to impact treatment:Structural or anatomic abnormalities   If treatment provided at initial evaluation, no  treatment charged due to lack of authorization.

## 2023-09-06 ENCOUNTER — Encounter (HOSPITAL_COMMUNITY): Payer: Self-pay

## 2023-09-06 ENCOUNTER — Ambulatory Visit (HOSPITAL_COMMUNITY)

## 2023-09-06 DIAGNOSIS — M79661 Pain in right lower leg: Secondary | ICD-10-CM

## 2023-09-06 DIAGNOSIS — M79671 Pain in right foot: Secondary | ICD-10-CM

## 2023-09-06 DIAGNOSIS — Z7409 Other reduced mobility: Secondary | ICD-10-CM

## 2023-09-06 NOTE — Therapy (Signed)
 OUTPATIENT PHYSICAL THERAPY LOWER EXTREMITY TREATMENT   Patient Name: Jeanette Morales MRN: [id number] DOB:1980/11/20, 43 y.o., female Today's Date: 09/06/2023  END OF SESSION:  PT End of Session - 09/06/23 1021     Visit Number 2    Number of Visits 12    Date for PT Re-Evaluation 10/02/23    Authorization Type [employer]    Authorization Time Period no auth required    Progress Note Due on Visit 10    PT Start Time 1021    PT Stop Time 1059    PT Time Calculation (min) 38 min    Activity Tolerance Patient tolerated treatment well    Behavior During Therapy WFL for tasks assessed/performed             Past Medical History:  Diagnosis Date   Hypertension    Palpitations    Past Surgical History:  Procedure Laterality Date   BREAST BIOPSY Right 2015   FOOT SURGERY Left 2019   NO PAST SURGERIES     Patient Active Problem List   Diagnosis Date Noted   ASCUS of cervix with negative high risk HPV 01/16/2023   Boil of vulva 11/14/2019   Encounter for gynecological examination with Papanicolaou smear of cervix 10/27/2019   Encounter for surveillance of contraceptive pills 10/27/2019   Hypertension 10/27/2019   Stress incontinence 09/24/2019   Lichen sclerosus 09/24/2019   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  Has began HEP without questions daily.  Eval:  Pt states she has been  dealing with Right heel pain for 10 years, recently when through a partial release of plantar fascia. Pt states it helped a little bit but. Pt states she can not stand or walk long on it with out it hurting or having to sit down. Pt tried therapy 2 times before the surgery. 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 Pt states she literally can barely make it back to the car after work, cries and hobbles into home.  PERTINENT HISTORY: -Left foot surgery 7 years ago, sesamoid removal -lost 65lbs in last year  PAIN:  Are you having pain? Yes: NPRS scale: 5/10 Pain location: arch of right foot,  Pain description: burning, sharp, shooting Aggravating factors: walking, standing Relieving factors: prednisone, ice  PRECAUTIONS: None  RED FLAGS: None   WEIGHT BEARING RESTRICTIONS: No  FALLS:  Has patient fallen in last 6 months? No  LIVING ENVIRONMENT: Lives with: lives with their family Lives in: House/apartment Stairs: Yes: External: 4 steps; on right going up Has following equipment at home: Crutches, on occassion  OCCUPATION: [employer], out on leave  PLOF: Independent with basic ADLs, Needs assistance with ADLs, and hired someone to help clean  PATIENT GOALS: walk and stand without pain in right foot, get back to work  NEXT MD VISIT: Early June  OBJECTIVE:  Note: Objective measures were  completed at Evaluation unless otherwise noted.  DIAGNOSTIC FINDINGS: CLINICAL DATA:  Heel pain, chronic surgical planning for plantar fasciitis   EXAM: MR OF THE RIGHT HEEL WITHOUT CONTRAST   TECHNIQUE: Multiplanar, multisequence MR imaging of the right foot was performed. No intravenous contrast was administered.   COMPARISON:  Right foot radiographs dated January 16, 2023.   FINDINGS: Bones/Joint/Cartilage   No fracture or dislocation. Normal alignment. No joint effusion. No marrow signal abnormality.   Ligaments   The components of the  lateral collateral ligament, deltoid ligament, and syndesmotic ligaments appear grossly intact. Lisfranc ligament is intact.   Muscles and Tendons   There is a longitudinal split tear of the peroneal longus and brevis tendons extending from the retromalleolar level through the midfoot with tenosynovitis of the common peroneal tendon sheath. Mild tenosynovitis of the posterior tibialis tendon just below the level of the medial malleolus. Flexor and extensor compartment tendons are intact. Achilles tendon is intact. Muscles are normal.   Plantar fascia   Thickening of the central cord of the plantar fascia with surrounding edema. There is an inferior calcaneal enthesophyte with mild associated edema.   Soft tissue Mild subcutaneous edema at the plantar heel.  Muscles are normal.   IMPRESSION: 1. Acute on chronic plantar fasciitis. 2. Longitudinal split tears of the peroneal longus and brevis tendons with tenosynovitis of the common peroneal tendon sheath. 3. Mild tenosynovitis of the posterior tibialis tendon.  PATIENT SURVEYS:  LEFS 35/80  COGNITION: Overall cognitive status: Within functional limits for tasks assessed     SENSATION: WFL  EDEMA:  On occasion, not at this time  PALPATION: Pt demonstrates tenderness on medial and lateral arch, lateral compartment of lower leg and posterior tib region  LOWER EXTREMITY ROM:  Active ROM Right eval Left eval  Hip flexion    Hip extension    Hip abduction    Hip adduction    Hip internal rotation    Hip external rotation    Knee flexion    Knee extension    Ankle dorsiflexion 9 9  Ankle plantarflexion 55 63  Ankle inversion 40 50  Ankle eversion 28, some pain lateral compartment 20   (Blank rows = not tested)  LOWER EXTREMITY MMT:  MMT Right eval Left eval  Hip flexion    Hip extension    Hip abduction    Hip adduction    Hip internal rotation    Hip external rotation    Knee flexion    Knee extension     Ankle dorsiflexion 4- 5  Ankle plantarflexion 4-, pain on inside of arch and lateral compartment of leg  4-  Ankle inversion 4 5  Ankle eversion 4- 5   (Blank rows = not tested)  LOWER EXTREMITY SPECIAL TESTS:  Ankle special tests: Great toe extension test: negative  FUNCTIONAL TESTS:  2 minute walk test: 294 feet SLS R: 5.60s L: 26s  GAIT: Distance walked: 294 Assistive device utilized: Pt reaching for handrail on wall several times for off weighting of RLE and None Level of assistance: Complete Independence Comments: Pt presents with increasingly antalgic gait pattern, decreased velocity, decreased stance time on RLE.  TREATMENT DATE:  09/04/2023  Reviewed goals  Educated importance of HEP complaince for maximal benefits Seated  - towel scrunch - Marble pick up - Arch formation  Standing: - Heel raise - Toe raises  - Calf stretch against30" holds  Manual in supine position with LE elevated: MFR techniques complete and instructed for self care to scar tissue on incision, STM to peroneal tendons and posterior tib insertion  Gait training- 2 point sequence with good mechanics 226ft   Evaluation: -ROM measured, Strength assessed, HEP prescribed, pt educated on prognosis, findings, and importance of HEP compliance if given.     PATIENT EDUCATION:  Education details: Pt was educated on findings of PT evaluation, prognosis, frequency of therapy visits and rationale, attendance policy, and HEP if given.   Person educated: Patient Education method: Explanation, Verbal cues, and Handouts Education comprehension: verbalized understanding, verbal cues required, and needs further education  HOME EXERCISE PROGRAM: Access Code: [email or web address] URL: [email or web address] Date: 09/04/2023 Prepared by: Armond Bertin  Exercises - Gastroc  Stretch on Wall  - 1 x daily - 7 x weekly - 3 sets - 30 hold - Single Leg Stance  - 1 x daily - 7 x weekly - 3 sets - 10 reps - Towel Scrunches  - 1 x daily - 7 x weekly - 3 sets - 10 reps - Arch Lifting  - 1 x daily - 7 x weekly - 3 sets - 10 reps   09/06/23: begin self manual scar tissue massage. Encouraged to walk with [facility] ASSESSMENT:  CLINICAL IMPRESSION: 09/06/23: Reviewed goals and educated importance of HEP compliance for maximal benefits.  Pt able to recall and demonstrate appropriate mechanics.  Session focus with intrinsic strengthening, ankle mobility, gait training and manual techniques.  Manual complete to address scar tissue on incision and STM to ankle musculature.  Educated techniques and encouraged pt to try at home as reports relief following manual.  Educated gait training to assist with weight bearing with reports of vast improvements and encouraged pt to use cane while walking into/out of work as reports it takes her 16 minutes to get into work.  EOS pt reports pain reduced to 4/10.  Eval:  Patient is a 43 y.o. female who was seen today for physical therapy evaluation and treatment for M72.2 (ICD-10-CM) - Plantar fasciitis of right foot.   Patient demonstrates antalgic gait, pain in right foot and lateral compartment of leg during AROM, decreased RLE strength, and impaired balance on RLE. Patient also demonstrates difficulty with ambulation during today's session with decreased stride length and velocity noted due increasing amount of pain during . Patient also demonstrates point tenderness to R foot arch, posterior tib insertion and origin, as well as along peroneal musculature.  Patient would benefit from skilled physical therapy for decreased pain of RLE, increased endurance with ambulation, increased LE strength, and balance for improved gait quality, return to higher level of function with ADLs, and progress towards therapy goals.   OBJECTIVE IMPAIRMENTS: Abnormal gait,  decreased activity tolerance, decreased balance, decreased endurance, decreased mobility, difficulty walking, decreased strength, and pain.   ACTIVITY LIMITATIONS: carrying, lifting, bending, standing, squatting, stairs, transfers, and locomotion level  PARTICIPATION LIMITATIONS: meal prep, cleaning, laundry, driving, shopping, community activity, occupation, and yard work  PERSONAL FACTORS: Past/current experiences, Time since onset of injury/illness/exacerbation, and 1 comorbidity: surgery already completed are also affecting patient's functional outcome.   REHAB POTENTIAL: Fair 3 months since surgery  CLINICAL DECISION MAKING: Stable/uncomplicated  EVALUATION COMPLEXITY: Low  GOALS: Goals reviewed with patient? No  SHORT TERM GOALS: Target date: 09/25/23  Patient will demonstrate evidence of independence with individualized HEP and will report compliance for at least 3 days per week for optimized progression towards remaining therapy goals. Baseline:  Goal status: INITIAL  2.  Patient will report a decrease in pain level during community ambulation by at least 2 points for improved quality of life. Baseline: 5/10 Goal status: INITIAL     LONG TERM GOALS: Target date: 10/16/23  Pt will demonstrate a an increase of at least 9 points on the LEFS for improved performance of community ambulation and ADL. Baseline: see objective Goal status: INITIAL  2.  Pt will improve 2 MWT by 140 feet in order to demonstrate improved functional ambulatory capacity in community setting.  Baseline: see objective Goal status: INITIAL  3.  Pt will demonstrate WFL pain free ROM (flexion and extension) in right ankle, for increased mobility and maximal efficiency of gait cycle during ambulation. Baseline: see objective Goal status: INITIAL  4.  Pt will demonstrate at least 4/5 MMT for right ankle extremity for increased strength during ADL and community ambulation. Baseline: see  objective Goal status: INITIAL  5.  Pt will improve R SLS by 10 seconds in order to improve balance and stability of RLE in comparison to LLE during functional activities. Baseline: see objective Goal status: INITIAL    PLAN:  PT FREQUENCY: 2x/week  PT DURATION: 6 weeks  PLANNED INTERVENTIONS: 97110-Therapeutic exercises, 97530- Therapeutic activity, 97112- Neuromuscular re-education, 97535- Self Care, 16109- Manual therapy, [phone]- Gait training, Patient/Family education, Balance training, Stair training, Joint mobilization, DME instructions, Cryotherapy, and Moist heat  PLAN FOR NEXT SESSION: consider SPC/AD training to increase endurance with community ambulation, manual therapy to R foot arch and peroneal tendons and posterior tib insertion, progress stretching and HEP to pt toleration  Minor Amble, LPTA/CLT; CBIS [phone]  1:30 PM, 09/06/23

## 2023-09-11 ENCOUNTER — Encounter (HOSPITAL_COMMUNITY)

## 2023-09-13 ENCOUNTER — Encounter (HOSPITAL_COMMUNITY)

## 2023-09-18 ENCOUNTER — Encounter (HOSPITAL_COMMUNITY)

## 2023-09-20 ENCOUNTER — Encounter (HOSPITAL_COMMUNITY)

## 2023-09-25 ENCOUNTER — Encounter (HOSPITAL_COMMUNITY): Payer: Self-pay

## 2023-09-25 ENCOUNTER — Ambulatory Visit (HOSPITAL_COMMUNITY)

## 2023-09-25 DIAGNOSIS — M79671 Pain in right foot: Secondary | ICD-10-CM

## 2023-09-25 DIAGNOSIS — M79661 Pain in right lower leg: Secondary | ICD-10-CM

## 2023-09-25 DIAGNOSIS — Z7409 Other reduced mobility: Secondary | ICD-10-CM

## 2023-09-25 NOTE — Therapy (Signed)
 OUTPATIENT PHYSICAL THERAPY LOWER EXTREMITY TREATMENT   Patient Name: Jeanette Morales MRN: [id number] DOB:1980/09/24, 43 y.o., female Today's Date: 09/25/2023  END OF SESSION:  PT End of Session - 09/25/23 0934     Visit Number 3    Number of Visits 12    Date for PT Re-Evaluation 10/02/23    Authorization Type [employer]    Authorization Time Period no auth required    Progress Note Due on Visit 10    PT Start Time 0935    PT Stop Time 1013    PT Time Calculation (min) 38 min    Activity Tolerance Patient tolerated treatment well    Behavior During Therapy WFL for tasks assessed/performed             Past Medical History:  Diagnosis Date   Hypertension    Palpitations    Past Surgical History:  Procedure Laterality Date   BREAST BIOPSY Right 2015   FOOT SURGERY Left 2019   NO PAST SURGERIES     Patient Active Problem List   Diagnosis Date Noted   ASCUS of cervix with negative high risk HPV 01/16/2023   Boil of vulva 11/14/2019   Encounter for gynecological examination with Papanicolaou smear of cervix 10/27/2019   Encounter for surveillance of contraceptive pills 10/27/2019   Hypertension 10/27/2019   Stress incontinence 09/24/2019   Lichen sclerosus 09/24/2019   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  Has been compliant with HEP and has began manual and has  began walking more.  Eval:  Pt states she has been dealing with Right heel pain for 10 years, recently when through a partial release of plantar fascia. Pt states it helped a little bit but. Pt states she can not stand or walk long on it with out it hurting or having to sit down. Pt tried therapy 2 times before the surgery. 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 Pt states she literally can barely make it back to the car after work, cries and hobbles into home.  PERTINENT HISTORY: -Left foot surgery 7 years ago, sesamoid removal -lost 65lbs in last year  PAIN:  Are you having pain? Yes: NPRS scale: 5/10 Pain location: arch of right foot,  Pain description: burning, sharp, shooting Aggravating factors: walking, standing Relieving factors: prednisone, ice  PRECAUTIONS: None  RED FLAGS: None   WEIGHT BEARING RESTRICTIONS: No  FALLS:  Has patient fallen in last 6 months? No  LIVING ENVIRONMENT: Lives with: lives with their family Lives in: House/apartment Stairs: Yes: External: 4 steps; on right going up Has following equipment at home: Crutches, on occassion  OCCUPATION: [employer], out on leave  PLOF: Independent with basic ADLs, Needs assistance with ADLs, and hired someone to help clean  PATIENT GOALS: walk and stand without pain in right foot, get back to work  NEXT MD VISIT: Early June  OBJECTIVE:  Note: Objective measures were completed at Evaluation unless otherwise noted.  DIAGNOSTIC FINDINGS: CLINICAL DATA:  Heel pain, chronic surgical planning for plantar fasciitis   EXAM: MR OF THE RIGHT HEEL WITHOUT CONTRAST   TECHNIQUE: Multiplanar, multisequence MR imaging of the right foot was performed. No intravenous contrast was administered.   COMPARISON:  Right foot radiographs dated January 16, 2023.   FINDINGS: Bones/Joint/Cartilage   No fracture or dislocation. Normal alignment. No joint effusion. No marrow signal  abnormality.   Ligaments   The components of the lateral collateral ligament, deltoid ligament, and syndesmotic ligaments appear grossly intact. Lisfranc ligament is intact.   Muscles and Tendons   There is a longitudinal split tear of the peroneal longus and brevis tendons extending from the retromalleolar level through the midfoot with tenosynovitis of the common peroneal tendon sheath. Mild tenosynovitis of the posterior tibialis tendon just below the level of the medial malleolus. Flexor and extensor compartment tendons are intact. Achilles tendon is intact. Muscles are normal.   Plantar fascia   Thickening of the central cord of the plantar fascia with surrounding edema. There is an inferior calcaneal enthesophyte with mild associated edema.   Soft tissue Mild subcutaneous edema at the plantar heel.  Muscles are normal.   IMPRESSION: 1. Acute on chronic plantar fasciitis. 2. Longitudinal split tears of the peroneal longus and brevis tendons with tenosynovitis of the common peroneal tendon sheath. 3. Mild tenosynovitis of the posterior tibialis tendon.  PATIENT SURVEYS:  LEFS 35/80  COGNITION: Overall cognitive status: Within functional limits for tasks assessed     SENSATION: WFL  EDEMA:  On occasion, not at this time  PALPATION: Pt demonstrates tenderness on medial and lateral arch, lateral compartment of lower leg and posterior tib region  LOWER EXTREMITY ROM:  Active ROM Right eval Left eval  Hip flexion    Hip extension    Hip abduction    Hip adduction    Hip internal rotation    Hip external rotation    Knee flexion    Knee extension    Ankle dorsiflexion 9 9  Ankle plantarflexion 55 63  Ankle inversion 40 50  Ankle eversion 28, some pain lateral compartment 20   (Blank rows = not tested)  LOWER EXTREMITY MMT:  MMT Right eval Left eval  Hip flexion    Hip extension    Hip abduction    Hip adduction    Hip internal rotation    Hip  external rotation    Knee flexion    Knee extension    Ankle dorsiflexion 4- 5  Ankle plantarflexion 4-, pain on inside of arch and lateral compartment of leg  4-  Ankle inversion 4 5  Ankle eversion 4- 5   (Blank rows = not tested)  LOWER EXTREMITY SPECIAL TESTS:  Ankle special tests: Great toe extension test: negative  FUNCTIONAL TESTS:  2 minute walk test: 294 feet SLS R: 5.60s L: 26s  GAIT: Distance walked: 294 Assistive device utilized: Pt reaching for handrail on wall several times for off weighting of RLE and None Level of assistance: Complete Independence Comments: Pt presents with increasingly antalgic gait pattern, decreased velocity, decreased stance time on RLE.  TREATMENT DATE:  09/25/23: Manual: to incision, posterior tib and peronals Seated: - YTB: Inversion then eversion 10x each Standing: - Heel raise 15x - Toe raises 15x - Rockerboard Inv/Ev then Pf/Df 1'x 2 - slant board 2x 30" - Plantar fascia stretch 2x 30" on bottom step   09/04/2023  Reviewed goals  Educated importance of HEP complaince for maximal benefits Seated  - towel scrunch - Marble pick up - Arch formation  Standing: - Heel raise - Toe raises  - Calf stretch against30" holds  Manual in supine position with LE elevated: MFR techniques complete and instructed for self care to scar tissue on incision, STM to peroneal tendons and posterior tib insertion  Gait training- 2 point sequence with good mechanics 23ft   Evaluation: -ROM measured, Strength assessed, HEP prescribed, pt educated on prognosis, findings, and importance of HEP compliance if given.     PATIENT EDUCATION:  Education details: Pt was educated on findings of PT evaluation, prognosis, frequency of therapy visits and rationale, attendance policy, and HEP if given.   Person educated:  Patient Education method: Explanation, Verbal cues, and Handouts Education comprehension: verbalized understanding, verbal cues required, and needs further education  HOME EXERCISE PROGRAM: Access Code: [email or web address] URL: [email or web address] Date: 09/04/2023 Prepared by: Armond Bertin  Exercises - Gastroc Stretch on Wall  - 1 x daily - 7 x weekly - 3 sets - 30 hold - Single Leg Stance  - 1 x daily - 7 x weekly - 3 sets - 10 reps - Towel Scrunches  - 1 x daily - 7 x weekly - 3 sets - 10 reps - Arch Lifting  - 1 x daily - 7 x weekly - 3 sets - 10 reps   09/06/23: begin self manual scar tissue massage. Encouraged to walk with [facility]  09/25/23: - Seated Figure 4 Ankle Inversion with Resistance  - 2 x daily - 7 x weekly - 1 sets - 10 reps - 5" hold - Seated Ankle Eversion with Resistance  - 2 x daily - 7 x weekly - 1 sets - 10 reps - 5" hold ASSESSMENT:  CLINICAL IMPRESSION: 09/25/23:  Pt limited by increased pain this session.  Manual complete with increased tenderness noted with posterior tib palpation.  Noted edema present proximal incision, reviewed benefits with ice application and educated on ice massage as well.  Added theraband resistance for ankle strengthening to HEP.  Progressed to standing strengthening exercises with noted instability with lateral movements with reports of increased pain following rockerboard.  Stretches complete with good form and mechanics.    Eval:  Patient is a 43 y.o. female who was seen today for physical therapy evaluation and treatment for M72.2 (ICD-10-CM) - Plantar fasciitis of right foot.   Patient demonstrates antalgic gait, pain in right foot and lateral compartment of leg during AROM, decreased RLE strength, and impaired balance on RLE. Patient also demonstrates difficulty with ambulation during today's session with decreased stride length and velocity noted due increasing amount of pain during . Patient also demonstrates point tenderness to  R foot arch, posterior tib insertion and origin, as well as along peroneal musculature.  Patient would benefit from skilled physical therapy for decreased pain of RLE, increased endurance with ambulation, increased LE strength, and balance for improved gait quality, return to higher level of function with ADLs, and progress towards therapy goals.   OBJECTIVE IMPAIRMENTS: Abnormal gait, decreased activity tolerance, decreased balance, decreased endurance, decreased mobility, difficulty walking, decreased strength, and pain.  ACTIVITY LIMITATIONS: carrying, lifting, bending, standing, squatting, stairs, transfers, and locomotion level  PARTICIPATION LIMITATIONS: meal prep, cleaning, laundry, driving, shopping, community activity, occupation, and yard work  PERSONAL FACTORS: Past/current experiences, Time since onset of injury/illness/exacerbation, and 1 comorbidity: surgery already completed are also affecting patient's functional outcome.   REHAB POTENTIAL: Fair 3 months since surgery  CLINICAL DECISION MAKING: Stable/uncomplicated  EVALUATION COMPLEXITY: Low   GOALS: Goals reviewed with patient? No  SHORT TERM GOALS: Target date: 09/25/23  Patient will demonstrate evidence of independence with individualized HEP and will report compliance for at least 3 days per week for optimized progression towards remaining therapy goals. Baseline:  Goal status: INITIAL  2.  Patient will report a decrease in pain level during community ambulation by at least 2 points for improved quality of life. Baseline: 5/10 Goal status: INITIAL     LONG TERM GOALS: Target date: 10/16/23  Pt will demonstrate a an increase of at least 9 points on the LEFS for improved performance of community ambulation and ADL. Baseline: see objective Goal status: INITIAL  2.  Pt will improve 2 MWT by 140 feet in order to demonstrate improved functional ambulatory capacity in community setting.  Baseline: see  objective Goal status: INITIAL  3.  Pt will demonstrate WFL pain free ROM (flexion and extension) in right ankle, for increased mobility and maximal efficiency of gait cycle during ambulation. Baseline: see objective Goal status: INITIAL  4.  Pt will demonstrate at least 4/5 MMT for right ankle extremity for increased strength during ADL and community ambulation. Baseline: see objective Goal status: INITIAL  5.  Pt will improve R SLS by 10 seconds in order to improve balance and stability of RLE in comparison to LLE during functional activities. Baseline: see objective Goal status: INITIAL    PLAN:  PT FREQUENCY: 2x/week  PT DURATION: 6 weeks  PLANNED INTERVENTIONS: 97110-Therapeutic exercises, 97530- Therapeutic activity, 97112- Neuromuscular re-education, 97535- Self Care, 11914- Manual therapy, [phone]- Gait training, Patient/Family education, Balance training, Stair training, Joint mobilization, DME instructions, Cryotherapy, and Moist heat  PLAN FOR NEXT SESSION: consider SPC/AD training to increase endurance with community ambulation, manual therapy to R foot arch and peroneal tendons and posterior tib insertion, progress stretching and HEP to pt toleration  Minor Amble, LPTA/CLT; CBIS [phone]  10:20 AM, 09/25/23

## 2023-09-27 ENCOUNTER — Encounter (HOSPITAL_COMMUNITY): Payer: Self-pay

## 2023-09-27 ENCOUNTER — Ambulatory Visit (HOSPITAL_COMMUNITY)

## 2023-09-27 DIAGNOSIS — M79671 Pain in right foot: Secondary | ICD-10-CM | POA: Diagnosis not present

## 2023-09-27 DIAGNOSIS — M79661 Pain in right lower leg: Secondary | ICD-10-CM

## 2023-09-27 DIAGNOSIS — Z7409 Other reduced mobility: Secondary | ICD-10-CM

## 2023-09-27 NOTE — Therapy (Signed)
 OUTPATIENT PHYSICAL THERAPY LOWER EXTREMITY TREATMENT   Patient Name: Jeanette Morales MRN: [id number] DOB:1981/04/08, 43 y.o., female Today's Date: 09/27/2023  END OF SESSION:  PT End of Session - 09/27/23 1021     Visit Number 4    Number of Visits 12    Date for PT Re-Evaluation 10/02/23    Authorization Type [employer]    Authorization Time Period no auth required    Authorization - Visit Number 3    Progress Note Due on Visit 10    PT Start Time 0935    PT Stop Time 1015    PT Time Calculation (min) 40 min    Activity Tolerance Patient tolerated treatment well;Patient limited by pain    Behavior During Therapy [facility] for tasks assessed/performed              Past Medical History:  Diagnosis Date   Hypertension    Palpitations    Past Surgical History:  Procedure Laterality Date   BREAST BIOPSY Right 2015   FOOT SURGERY Left 2019   NO PAST SURGERIES     Patient Active Problem List   Diagnosis Date Noted   ASCUS of cervix with negative high risk HPV 01/16/2023   Boil of vulva 11/14/2019   Encounter for gynecological examination with Papanicolaou smear of cervix 10/27/2019   Encounter for surveillance of contraceptive pills 10/27/2019   Hypertension 10/27/2019   Stress incontinence 09/24/2019   Lichen sclerosus 09/24/2019   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 Pt states the exercises  are going well, does them every day. Pt states she does use cane if she has to do a lot of walking.   Eval:  Pt states she has been dealing with Right heel pain for 10 years, recently when through a partial release of plantar fascia. Pt states it helped a little bit but. Pt states she can not stand or walk long on it with out it hurting or having to sit down. Pt tried therapy 2 times before the surgery. 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 Pt states she literally can barely make it back to the car after work, cries and hobbles into home.  PERTINENT HISTORY: -Left foot surgery 7 years ago, sesamoid removal -lost 65lbs in last year  PAIN:  Are you having pain? Yes: NPRS scale: 5/10 Pain location: arch of right foot,  Pain description: burning, sharp, shooting Aggravating factors: walking, standing Relieving factors: prednisone, ice  PRECAUTIONS: None  RED FLAGS: None   WEIGHT BEARING RESTRICTIONS: No  FALLS:  Has patient fallen in last 6 months? No  LIVING ENVIRONMENT: Lives with: lives with their family Lives in: House/apartment Stairs: Yes: External: 4 steps; on right going up Has following equipment at home: Crutches, on occassion  OCCUPATION: [employer], out on leave  PLOF: Independent with basic ADLs, Needs assistance with ADLs, and hired someone  to help clean  PATIENT GOALS: walk and stand without pain in right foot, get back to work  NEXT MD VISIT: Early June  OBJECTIVE:  Note: Objective measures were completed at Evaluation unless otherwise noted.  DIAGNOSTIC FINDINGS: CLINICAL DATA:  Heel pain, chronic surgical planning for plantar fasciitis   EXAM: MR OF THE RIGHT HEEL WITHOUT CONTRAST   TECHNIQUE: Multiplanar, multisequence MR imaging of the right foot was performed. No intravenous contrast was administered.   COMPARISON:  Right foot radiographs dated January 16, 2023.   FINDINGS: Bones/Joint/Cartilage    No fracture or dislocation. Normal alignment. No joint effusion. No marrow signal abnormality.   Ligaments   The components of the lateral collateral ligament, deltoid ligament, and syndesmotic ligaments appear grossly intact. Lisfranc ligament is intact.   Muscles and Tendons   There is a longitudinal split tear of the peroneal longus and brevis tendons extending from the retromalleolar level through the midfoot with tenosynovitis of the common peroneal tendon sheath. Mild tenosynovitis of the posterior tibialis tendon just below the level of the medial malleolus. Flexor and extensor compartment tendons are intact. Achilles tendon is intact. Muscles are normal.   Plantar fascia   Thickening of the central cord of the plantar fascia with surrounding edema. There is an inferior calcaneal enthesophyte with mild associated edema.   Soft tissue Mild subcutaneous edema at the plantar heel.  Muscles are normal.   IMPRESSION: 1. Acute on chronic plantar fasciitis. 2. Longitudinal split tears of the peroneal longus and brevis tendons with tenosynovitis of the common peroneal tendon sheath. 3. Mild tenosynovitis of the posterior tibialis tendon.  PATIENT SURVEYS:  LEFS 35/80  COGNITION: Overall cognitive status: Within functional limits for tasks assessed     SENSATION: WFL  EDEMA:  On occasion, not at this time  PALPATION: Pt demonstrates tenderness on medial and lateral arch, lateral compartment of lower leg and posterior tib region  LOWER EXTREMITY ROM:  Active ROM Right eval Left eval  Hip flexion    Hip extension    Hip abduction    Hip adduction    Hip internal rotation    Hip external rotation    Knee flexion    Knee extension    Ankle dorsiflexion 9 9  Ankle plantarflexion 55 63  Ankle inversion 40 50  Ankle eversion 28, some pain lateral compartment 20   (Blank rows = not tested)  LOWER EXTREMITY MMT:  MMT Right eval Left eval  Hip flexion     Hip extension    Hip abduction    Hip adduction    Hip internal rotation    Hip external rotation    Knee flexion    Knee extension    Ankle dorsiflexion 4- 5  Ankle plantarflexion 4-, pain on inside of arch and lateral compartment of leg  4-  Ankle inversion 4 5  Ankle eversion 4- 5   (Blank rows = not tested)  LOWER EXTREMITY SPECIAL TESTS:  Ankle special tests: Great toe extension test: negative  FUNCTIONAL TESTS:  2 minute walk test: 294 feet SLS R: 5.60s L: 26s  GAIT: Distance walked: 294 Assistive device utilized: Pt reaching for handrail on wall several times for off weighting of RLE and None Level of assistance: Complete Independence Comments: Pt presents with increasingly antalgic gait pattern, decreased velocity, decreased stance time on RLE.  TREATMENT DATE:  09/27/2023  Manual Therapy: -STM: to incision, medial arch, posterior tib and peroneals -Manual achilles and posterior tibialis stretch  Therapeutic Exercise: -Heel/toe raises, 2 sets of 10 reps, seated as last time was too painful -Foot towel scrunches, 2 sets of 10 reps seated, pt cued for slow and controlled movement -Rocker board DF/PF, 2 sets of 10 reps -BAPs Board, level 3, 1 set of 8 reps clockwise and counter clockwise -BAPs Board, level 3, 1 set of 8 reps, DF/PF, EV/IV, pt reports increase in symptoms with inversion eversion variation -Forward lunges on bosu ball, 1 set of 5 reps better performance going into RLE, pt cued for core activation and upright posture, pt reports R knee pain -Deadmills fwd/bwd, 2 sets of 30 seconds each variation    09/25/23: Manual: to incision, posterior tib and peronals Seated: - YTB: Inversion then eversion 10x each Standing: - Heel raise 15x - Toe raises 15x - Rockerboard Inv/Ev then Pf/Df 1'x 2 - slant board 2x 30" - Plantar fascia  stretch 2x 30" on bottom step   09/04/2023  Reviewed goals  Educated importance of HEP complaince for maximal benefits Seated  - towel scrunch - Marble pick up - Arch formation  Standing: - Heel raise - Toe raises  - Calf stretch against30" holds  Manual in supine position with LE elevated: MFR techniques complete and instructed for self care to scar tissue on incision, STM to peroneal tendons and posterior tib insertion  Gait training- 2 point sequence with good mechanics 249ft   Evaluation: -ROM measured, Strength assessed, HEP prescribed, pt educated on prognosis, findings, and importance of HEP compliance if given.     PATIENT EDUCATION:  Education details: Pt was educated on findings of PT evaluation, prognosis, frequency of therapy visits and rationale, attendance policy, and HEP if given.   Person educated: Patient Education method: Explanation, Verbal cues, and Handouts Education comprehension: verbalized understanding, verbal cues required, and needs further education  HOME EXERCISE PROGRAM: Access Code: [email or web address] URL: [email or web address] Date: 09/04/2023 Prepared by: Armond Bertin  Exercises - Gastroc Stretch on Wall  - 1 x daily - 7 x weekly - 3 sets - 30 hold - Single Leg Stance  - 1 x daily - 7 x weekly - 3 sets - 10 reps - Towel Scrunches  - 1 x daily - 7 x weekly - 3 sets - 10 reps - Arch Lifting  - 1 x daily - 7 x weekly - 3 sets - 10 reps   09/06/23: begin self manual scar tissue massage. Encouraged to walk with [facility]  09/25/23: - Seated Figure 4 Ankle Inversion with Resistance  - 2 x daily - 7 x weekly - 1 sets - 10 reps - 5" hold - Seated Ankle Eversion with Resistance  - 2 x daily - 7 x weekly - 1 sets - 10 reps - 5" hold ASSESSMENT:  CLINICAL IMPRESSION: Patient continues to demonstrate increased pain in right foot, decreased RLE strength, decreased gait quality and activity tolerance. Patient also continues to demonstrates tenderness  to manual therapy of medial arch, posterior tibialis and peroneal tendons and muscle belly's. Patient able to progress dynamic balance and core activation exercises today with BAPs board and deadmills, good performance with verbal cueing. Patient would continue to benefit from skilled physical therapy for decreased right foot pain, increased endurance with ambulation, increased RLE strength, and improved balance for improved quality of life, improved independence with gait training and continued progress towards therapy goals.  Eval:  Patient is a 43 y.o. female who was seen today for physical therapy evaluation and treatment for M72.2 (ICD-10-CM) - Plantar fasciitis of right foot. Patient demonstrates antalgic gait, pain in right foot and lateral compartment of leg during AROM, decreased RLE strength, and impaired balance on RLE. Patient also demonstrates difficulty with ambulation during today's session with decreased stride length and velocity noted due increasing amount of pain during . Patient also demonstrates point tenderness to R foot arch, posterior tib insertion and origin, as well as along peroneal musculature.  Patient would benefit from skilled physical therapy for decreased pain of RLE, increased endurance with ambulation, increased LE strength, and balance for improved gait quality, return to higher level of function with ADLs, and progress towards therapy goals.   OBJECTIVE IMPAIRMENTS: Abnormal gait, decreased activity tolerance, decreased balance, decreased endurance, decreased mobility, difficulty walking, decreased strength, and pain.   ACTIVITY LIMITATIONS: carrying, lifting, bending, standing, squatting, stairs, transfers, and locomotion level  PARTICIPATION LIMITATIONS: meal prep, cleaning, laundry, driving, shopping, community activity, occupation, and yard work  PERSONAL FACTORS: Past/current experiences, Time since onset of injury/illness/exacerbation, and 1 comorbidity:  surgery already completed are also affecting patient's functional outcome.   REHAB POTENTIAL: Fair 3 months since surgery  CLINICAL DECISION MAKING: Stable/uncomplicated  EVALUATION COMPLEXITY: Low   GOALS: Goals reviewed with patient? No  SHORT TERM GOALS: Target date: 09/25/23  Patient will demonstrate evidence of independence with individualized HEP and will report compliance for at least 3 days per week for optimized progression towards remaining therapy goals. Baseline:  Goal status: INITIAL  2.  Patient will report a decrease in pain level during community ambulation by at least 2 points for improved quality of life. Baseline: 5/10 Goal status: INITIAL     LONG TERM GOALS: Target date: 10/16/23  Pt will demonstrate a an increase of at least 9 points on the LEFS for improved performance of community ambulation and ADL. Baseline: see objective Goal status: INITIAL  2.  Pt will improve 2 MWT by 140 feet in order to demonstrate improved functional ambulatory capacity in community setting.  Baseline: see objective Goal status: INITIAL  3.  Pt will demonstrate WFL pain free ROM (flexion and extension) in right ankle, for increased mobility and maximal efficiency of gait cycle during ambulation. Baseline: see objective Goal status: INITIAL  4.  Pt will demonstrate at least 4/5 MMT for right ankle extremity for increased strength during ADL and community ambulation. Baseline: see objective Goal status: INITIAL  5.  Pt will improve R SLS by 10 seconds in order to improve balance and stability of RLE in comparison to LLE during functional activities. Baseline: see objective Goal status: INITIAL    PLAN:  PT FREQUENCY: 2x/week  PT DURATION: 6 weeks  PLANNED INTERVENTIONS: 97110-Therapeutic exercises, 97530- Therapeutic activity, 97112- Neuromuscular re-education, 97535- Self Care, 62130- Manual therapy, [phone]- Gait training, Patient/Family education, Balance training,  Stair training, Joint mobilization, DME instructions, Cryotherapy, and Moist heat  PLAN FOR NEXT SESSION: consider SPC/AD training to increase endurance with community ambulation, manual therapy to R foot arch and peroneal tendons and posterior tib insertion, progress stretching and HEP to pt toleration  Armond Bertin, PT, DPT [facility] Office: [phone] 10:30 AM, 09/27/23

## 2023-10-01 ENCOUNTER — Encounter: Payer: Self-pay | Admitting: Podiatry

## 2023-10-01 ENCOUNTER — Ambulatory Visit (INDEPENDENT_AMBULATORY_CARE_PROVIDER_SITE_OTHER): Admitting: Podiatry

## 2023-10-01 ENCOUNTER — Encounter: Payer: Self-pay | Admitting: Rehabilitative and Restorative Service Providers"

## 2023-10-01 DIAGNOSIS — M722 Plantar fascial fibromatosis: Secondary | ICD-10-CM

## 2023-10-01 NOTE — Progress Notes (Signed)
  Subjective:  Patient ID: Jeanette Morales, female    DOB: [birth year],  MRN: [id number]  Chief Complaint  Patient presents with   Routine Post Op    Rm22/ Post op right foot plantar fascia/patient in physical therapy/patient  says she is still in pain with burning and sharp pain.      DOS: 06/26/23  Procedure: Right plantar fasciotomy   43 y.o. female returns for POV#4. Relates doing ok still getting some pain has been in PT. Aaron Aas   Review of Systems: Negative except as noted in the HPI. Denies N/V/F/Ch.  Past Medical History:  Diagnosis Date   Hypertension    Palpitations     Current Outpatient Medications:    DULoxetine (CYMBALTA) 30 MG capsule, Take 30 mg by mouth daily., Disp: , Rfl:    JUNEL  1/20 1-20 MG-MCG tablet, Take 1 tablet by mouth daily., Disp: 84 tablet, Rfl: 4   losartan -hydrochlorothiazide (HYZAAR) 50-12.5 MG tablet, Take 1 tablet by mouth daily., Disp: 90 tablet, Rfl: 3   meloxicam  (MOBIC ) 15 MG tablet, Take 1 tablet (15 mg total) by mouth daily., Disp: 30 tablet, Rfl: 0   methylPREDNISolone  (MEDROL  DOSEPAK) 4 MG TBPK tablet, Take as directed, Disp: 21 tablet, Rfl: 0   ondansetron  (ZOFRAN ) 4 MG tablet, Take 1 tablet (4 mg total) by mouth every 8 (eight) hours as needed for nausea or vomiting., Disp: 20 tablet, Rfl: 0   UNABLE TO FIND, Ozempic compound-weekly, Disp: , Rfl:   Social History   Tobacco Use  Smoking Status Never  Smokeless Tobacco Never    Allergies  Allergen Reactions   Lexapro [Escitalopram Oxalate]     agitation   Objective:  There were no vitals filed for this visit. There is no height or weight on file to calculate BMI. Constitutional Well developed. Well nourished.  Vascular Foot warm and well perfused. Capillary refill normal to all digits.   Neurologic Normal speech. Oriented to person, place, and time. Epicritic sensation to light touch grossly present bilaterally.  Dermatologic Skin healing well without signs of infection. Skin  edges well coapted without signs of infection.  Orthopedic: Tenderness to palpation noted about the surgical site.   Radiographs: No changes in osseous structure  Assessment:   1. Plantar fasciitis of right foot      Plan:  Patient was evaluated and treated and all questions answered.  S/p foot surgery right -Progressing as expected post-operatively. -WB Status: WBAT in regular shoe -Continue PT.  -Molded for orthotics today to see if this will help.  Did discuss injection today and will consider for next follow-up if still causing trouble.  Will have her to continue to stay out of work for next four weeks until. 10/29/23 -Medications: n/a   Return in 4 weeks for recheck  Return in about 4 weeks (around 10/29/2023) for r pf , post op.

## 2023-10-01 NOTE — Progress Notes (Signed)
 Orthotics   Patient was present and evaluated for Custom molded foot orthotics. Patient will benefit from CFO's to provide total contact to BIL MLA's helping to balance and distribute body weight more evenly across BIL feet helping to reduce plantar pressure and pain. Orthotic will also encourage FF / RF alignment  Patient was scanned today and will return for fitting upon receipt

## 2023-10-02 ENCOUNTER — Ambulatory Visit (HOSPITAL_COMMUNITY): Attending: Podiatry

## 2023-10-02 ENCOUNTER — Encounter (HOSPITAL_COMMUNITY): Payer: Self-pay

## 2023-10-02 DIAGNOSIS — M79661 Pain in right lower leg: Secondary | ICD-10-CM | POA: Diagnosis present

## 2023-10-02 DIAGNOSIS — Z7409 Other reduced mobility: Secondary | ICD-10-CM | POA: Insufficient documentation

## 2023-10-02 DIAGNOSIS — M79671 Pain in right foot: Secondary | ICD-10-CM | POA: Insufficient documentation

## 2023-10-02 NOTE — Therapy (Signed)
 OUTPATIENT PHYSICAL THERAPY LOWER EXTREMITY TREATMENT   Patient Name: Jeanette Morales MRN: [id number] DOB:13-Jul-1980, 43 y.o., female Today's Date: 10/02/2023  END OF SESSION:  PT End of Session - 10/02/23 0928     Visit Number 5    Number of Visits 12    Date for PT Re-Evaluation 10/02/23    Authorization Type [employer]    Authorization Time Period no auth required    Authorization - Visit Number 4    Progress Note Due on Visit 10    PT Start Time 0848    PT Stop Time 0928    PT Time Calculation (min) 40 min    Activity Tolerance Patient tolerated treatment well;Patient limited by pain    Behavior During Therapy [facility] for tasks assessed/performed               Past Medical History:  Diagnosis Date   Hypertension    Palpitations    Past Surgical History:  Procedure Laterality Date   BREAST BIOPSY Right 2015   FOOT SURGERY Left 2019   NO PAST SURGERIES     Patient Active Problem List   Diagnosis Date Noted   ASCUS of cervix with negative high risk HPV 01/16/2023   Boil of vulva 11/14/2019   Encounter for gynecological examination with Papanicolaou smear of cervix 10/27/2019   Encounter for surveillance of contraceptive pills 10/27/2019   Hypertension 10/27/2019   Stress incontinence 09/24/2019   Lichen sclerosus 09/24/2019   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  Exercises are going well, feels  she is getting stronger though still continues to have pain.  Uses cane on long distance.  Saw MD yesterday, plan to continue PT through POC.  Did discuss orthortics, another shot and possible another release.     Eval:  Pt states she has been dealing with Right heel pain for 10 years, recently when through a partial release of plantar fascia. Pt states it helped a little bit but. Pt states she can not stand or walk long on it with out it hurting or having to sit down. Pt tried therapy 2 times before the surgery. 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 Pt states she literally can barely make it back to the car after work, cries and hobbles into home.  PERTINENT HISTORY: -Left foot surgery 7 years ago, sesamoid removal -lost 65lbs in last year  PAIN:  Are you having pain? Yes: NPRS scale: 5/10 Pain location: arch of right foot,  Pain description: burning, sharp, shooting Aggravating factors: walking, standing Relieving factors: prednisone, ice  PRECAUTIONS: None  RED FLAGS: None   WEIGHT BEARING RESTRICTIONS: No  FALLS:  Has patient fallen in last 6 months? No  LIVING ENVIRONMENT: Lives with: lives with their family Lives in: House/apartment Stairs: Yes: External: 4 steps; on right going up Has following equipment at home: Crutches, on [employer]  OCCUPATION: [employer], out on leave  PLOF: Independent with basic ADLs, Needs assistance with ADLs, and hired someone to help clean  PATIENT GOALS: walk and stand without pain in right foot, get back to work  NEXT MD VISIT: June 30th  OBJECTIVE:  Note: Objective measures were completed at Evaluation unless otherwise noted.  DIAGNOSTIC FINDINGS: CLINICAL DATA:  Heel pain, chronic surgical planning for plantar fasciitis   EXAM: MR OF THE RIGHT HEEL WITHOUT CONTRAST   TECHNIQUE: Multiplanar, multisequence MR imaging of the right foot was performed. No intravenous contrast was  administered.   COMPARISON:  Right foot radiographs dated January 16, 2023.   FINDINGS: Bones/Joint/Cartilage   No fracture or dislocation. Normal alignment. No joint effusion. No marrow signal abnormality.   Ligaments   The components of the lateral collateral ligament, deltoid ligament, and syndesmotic ligaments appear grossly intact. Lisfranc ligament is intact.   Muscles and Tendons   There is a longitudinal split tear of the peroneal longus and brevis tendons extending from the retromalleolar level through the midfoot with tenosynovitis of the common peroneal tendon sheath. Mild tenosynovitis of the posterior tibialis tendon just below the level of the medial malleolus. Flexor and extensor compartment tendons are intact. Achilles tendon is intact. Muscles are normal.   Plantar fascia   Thickening of the central cord of the plantar fascia with surrounding edema. There is an inferior calcaneal enthesophyte with mild associated edema.   Soft tissue Mild subcutaneous edema at the plantar heel.  Muscles are normal.   IMPRESSION: 1. Acute on chronic plantar fasciitis. 2. Longitudinal split tears of the peroneal longus and brevis tendons with tenosynovitis of the common peroneal tendon sheath. 3. Mild tenosynovitis of the posterior tibialis tendon.  PATIENT SURVEYS:  LEFS 35/80  COGNITION: Overall cognitive status: Within functional limits for tasks assessed     SENSATION: WFL  EDEMA:  On occasion, not at this time  PALPATION: Pt demonstrates tenderness on medial and lateral arch, lateral compartment of lower leg and posterior tib region  LOWER EXTREMITY ROM:  Active ROM Right eval Left eval  Hip flexion    Hip extension    Hip abduction    Hip adduction    Hip internal rotation    Hip external rotation    Knee flexion    Knee extension    Ankle dorsiflexion 9 9  Ankle plantarflexion 55 63  Ankle inversion 40 50  Ankle eversion 28, some pain  lateral compartment 20   (Blank rows = not tested)  LOWER EXTREMITY MMT:  MMT Right eval Left eval  Hip flexion    Hip extension    Hip abduction    Hip adduction    Hip internal rotation    Hip external rotation    Knee flexion    Knee extension    Ankle dorsiflexion 4- 5  Ankle plantarflexion 4-, pain on inside of arch and lateral compartment of leg  4-  Ankle inversion 4 5  Ankle eversion 4- 5   (Blank rows = not tested)  LOWER EXTREMITY SPECIAL TESTS:  Ankle special tests: Great toe extension test: negative  FUNCTIONAL TESTS:  2 minute walk test: 294 feet SLS R: 5.60s L: 26s  GAIT: Distance walked: 294 Assistive device utilized: Pt reaching for handrail on wall several times for off weighting of RLE and None Level of assistance: Complete Independence Comments: Pt presents with increasingly antalgic gait pattern, decreased velocity, decreased stance time on RLE.  TREATMENT DATE:  10/02/23: - BAPs Board, level 3, 2 set of 10 reps, DF/PF, EV/IV, CW/CCW (increased difficulty with inversion/eversion - Heel/Toe raises 2x 10 - Vector stance with 1 UE support 3x 5" - Slant board 3x 30"  Manual therapy:   - STM: to incision, medial arch, posterior tib and peroneals - Manual achilles and posterior tibialis stretch - Kinesiotape for plantar fascia with 3 strips  09/27/2023  Manual Therapy: -STM: to incision, medial arch, posterior tib and peroneals -Manual achilles and posterior tibialis stretch  Therapeutic Exercise: -Heel/toe raises, 2 sets of 10 reps, seated as last time was too painful -Foot towel scrunches, 2 sets of 10 reps seated, pt cued for slow and controlled movement -Rocker board DF/PF, 2 sets of 10 reps -BAPs Board, level 3, 1 set of 8 reps clockwise and counter clockwise -BAPs Board, level 3, 1 set of 8 reps, DF/PF, EV/IV, pt  reports increase in symptoms with inversion eversion variation -Forward lunges on bosu ball, 1 set of 5 reps better performance going into RLE, pt cued for core activation and upright posture, pt reports R knee pain -[place] fwd/bwd, 2 sets of 30 seconds each variation    09/25/23: Manual: to incision, posterior tib and peronals Seated: - YTB: Inversion then eversion 10x each Standing: - Heel raise 15x - Toe raises 15x - Rockerboard Inv/Ev then Pf/Df 1'x 2 - slant board 2x 30" - Plantar fascia stretch 2x 30" on bottom step   09/04/2023  Reviewed goals  Educated importance of HEP complaince for maximal benefits Seated  - towel scrunch - Marble pick up - Arch formation  Standing: - Heel raise - Toe raises  - Calf stretch against30" holds  Manual in supine position with LE elevated: MFR techniques complete and instructed for self care to scar tissue on incision, STM to peroneal tendons and posterior tib insertion  Gait training- 2 point sequence with good mechanics 242ft   Evaluation: -ROM measured, Strength assessed, HEP prescribed, pt educated on prognosis, findings, and importance of HEP compliance if given.     PATIENT EDUCATION:  Education details: Pt was educated on findings of PT evaluation, prognosis, frequency of therapy visits and rationale, attendance policy, and HEP if given.   Person educated: Patient Education method: Explanation, Verbal cues, and Handouts Education comprehension: verbalized understanding, verbal cues required, and needs further education  HOME EXERCISE PROGRAM: Access Code: [email or web address] URL: [email or web address] Date: 09/04/2023 Prepared by: Armond Bertin  Exercises - Gastroc Stretch on Wall  - 1 x daily - 7 x weekly - 3 sets - 30 hold - Single Leg Stance  - 1 x daily - 7 x weekly - 3 sets - 10 reps - Towel Scrunches  - 1 x daily - 7 x weekly - 3 sets - 10 reps - Arch Lifting  - 1 x daily - 7 x weekly - 3 sets - 10  reps   09/06/23: begin self manual scar tissue massage. Encouraged to walk with [facility]  09/25/23: - Seated Figure 4 Ankle Inversion with Resistance  - 2 x daily - 7 x weekly - 1 sets - 10 reps - 5" hold - Seated Ankle Eversion with Resistance  - 2 x daily - 7 x weekly - 1 sets - 10 reps - 5" hold ASSESSMENT:  CLINICAL IMPRESSION:  Patient continues to be limited with pain Rt foot, decreased Rt LE strength, decreased gait quality and activity tolerance.  Pt continues to demonstrate ankle instability with increased difficulty and pain  with inversion/eversion motions.  Trial with kinesiotape to assist with ankle stability.  Manual complete with tenderness noted with palpation to posterior tib and over incision that has some inflammation present.  Reviewed benefits with ice for edema control with verbalized understanding.  Continued exercises to progress dynamics balance for hip stability and mobility with stretches.    Eval:  Patient is a 43 y.o. female who was seen today for physical therapy evaluation and treatment for M72.2 (ICD-10-CM) - Plantar fasciitis of right foot. Patient demonstrates antalgic gait, pain in right foot and lateral compartment of leg during AROM, decreased RLE strength, and impaired balance on RLE. Patient also demonstrates difficulty with ambulation during today's session with decreased stride length and velocity noted due increasing amount of pain during . Patient also demonstrates point tenderness to R foot arch, posterior tib insertion and origin, as well as along peroneal musculature.  Patient would benefit from skilled physical therapy for decreased pain of RLE, increased endurance with ambulation, increased LE strength, and balance for improved gait quality, return to higher level of function with ADLs, and progress towards therapy goals.   OBJECTIVE IMPAIRMENTS: Abnormal gait, decreased activity tolerance, decreased balance, decreased endurance, decreased mobility,  difficulty walking, decreased strength, and pain.   ACTIVITY LIMITATIONS: carrying, lifting, bending, standing, squatting, stairs, transfers, and locomotion level  PARTICIPATION LIMITATIONS: meal prep, cleaning, laundry, driving, shopping, community activity, occupation, and yard work  PERSONAL FACTORS: Past/current experiences, Time since onset of injury/illness/exacerbation, and 1 comorbidity: surgery already completed are also affecting patient's functional outcome.   REHAB POTENTIAL: Fair 3 months since surgery  CLINICAL DECISION MAKING: Stable/uncomplicated  EVALUATION COMPLEXITY: Low   GOALS: Goals reviewed with patient? No  SHORT TERM GOALS: Target date: 09/25/23  Patient will demonstrate evidence of independence with individualized HEP and will report compliance for at least 3 days per week for optimized progression towards remaining therapy goals. Baseline: 10/02/23:  Reports compliance with HEP daily. Goal status: MET  2.  Patient will report a decrease in pain level during community ambulation by at least 2 points for improved quality of life. Baseline: 5/10; 10/02/23:  Continues to be limited by pain, increased with standing/walking Goal status: IN PROGRESS     LONG TERM GOALS: Target date: 10/16/23  Pt will demonstrate a an increase of at least 9 points on the LEFS for improved performance of community ambulation and ADL. Baseline: see objective Goal status: INITIAL  2.  Pt will improve 2 MWT by 140 feet in order to demonstrate improved functional ambulatory capacity in community setting.  Baseline: see objective Goal status: INITIAL  3.  Pt will demonstrate WFL pain free ROM (flexion and extension) in right ankle, for increased mobility and maximal efficiency of gait cycle during ambulation. Baseline: see objective Goal status: INITIAL  4.  Pt will demonstrate at least 4/5 MMT for right ankle extremity for increased strength during ADL and community  ambulation. Baseline: see objective Goal status: INITIAL  5.  Pt will improve R SLS by 10 seconds in order to improve balance and stability of RLE in comparison to LLE during functional activities. Baseline: see objective Goal status: INITIAL    PLAN:  PT FREQUENCY: 2x/week  PT DURATION: 6 weeks  PLANNED INTERVENTIONS: 97110-Therapeutic exercises, 97530- Therapeutic activity, 97112- Neuromuscular re-education, 97535- Self Care, [id number]- Manual therapy, [phone]- Gait training, Patient/Family education, Balance training, Stair training, Joint mobilization, DME instructions, Cryotherapy, and Moist heat  PLAN FOR NEXT SESSION: consider SPC/AD training to increase endurance with community  ambulation, manual therapy to R foot arch and peroneal tendons and posterior tib insertion, progress stretching and HEP to pt toleration.  Review goals next session.  Minor Amble, LPTA/CLT; CBIS [phone]  9:42 AM, 10/02/23

## 2023-10-03 DIAGNOSIS — Z0271 Encounter for disability determination: Secondary | ICD-10-CM

## 2023-10-03 NOTE — Telephone Encounter (Signed)
 Recd form from [place]. Faxed [phone] form/notes RTW 11/06/23

## 2023-10-04 ENCOUNTER — Ambulatory Visit (HOSPITAL_COMMUNITY): Admitting: Physical Therapy

## 2023-10-04 DIAGNOSIS — Z7409 Other reduced mobility: Secondary | ICD-10-CM

## 2023-10-04 DIAGNOSIS — M79671 Pain in right foot: Secondary | ICD-10-CM

## 2023-10-04 DIAGNOSIS — M79661 Pain in right lower leg: Secondary | ICD-10-CM

## 2023-10-04 NOTE — Therapy (Signed)
 OUTPATIENT PHYSICAL THERAPY LOWER EXTREMITY TREATMENT   Patient Name: Jeanette Morales MRN: [id number] DOB:1980-09-27, 43 y.o., female Today's Date: 10/04/2023  END OF SESSION:  PT End of Session - 10/04/23 1301     Visit Number 6    Number of Visits 12    Date for PT Re-Evaluation 10/02/23    Authorization Type [employer]    Authorization Time Period no auth required    Progress Note Due on Visit 10    PT Start Time 1018    PT Stop Time 1100    PT Time Calculation (min) 42 min    Activity Tolerance Patient tolerated treatment well;Patient limited by pain    Behavior During Therapy WFL for tasks assessed/performed                Past Medical History:  Diagnosis Date   Hypertension    Palpitations    Past Surgical History:  Procedure Laterality Date   BREAST BIOPSY Right 2015   FOOT SURGERY Left 2019   NO PAST SURGERIES     Patient Active Problem List   Diagnosis Date Noted   ASCUS of cervix with negative high risk HPV 01/16/2023   Boil of vulva 11/14/2019   Encounter for gynecological examination with Papanicolaou smear of cervix 10/27/2019   Encounter for surveillance of contraceptive pills 10/27/2019   Hypertension 10/27/2019   Stress incontinence 09/24/2019   Lichen sclerosus 09/24/2019   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  Comes today with increased antalgia due to dog running  into her Lt knee.  States she heard a pop or crack when it happened and it hyperextended.   States she doesn't feel like the kinesiotape had any affect.     Eval:  Pt states she has been dealing with Right heel pain for 10 years, recently when through a partial release of plantar fascia. Pt states it helped a little bit but. Pt states she can not stand or walk long on it with out it hurting or having to sit down. Pt tried therapy 2 times before the surgery. 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 Pt states she literally can barely make it back to the car after work, cries and hobbles into home.  PERTINENT HISTORY: -Left foot surgery 7 years ago, sesamoid removal -lost 65lbs in last year  PAIN:  Are you having pain? Yes: NPRS scale: 5/10 Pain location: arch of right foot,  Pain description: burning, sharp, shooting Aggravating factors: walking, standing Relieving factors: prednisone, ice  PRECAUTIONS: None  RED FLAGS: None   WEIGHT BEARING RESTRICTIONS: No  FALLS:  Has patient fallen in last 6 months? No  LIVING ENVIRONMENT: Lives with: lives with their family Lives in: House/apartment Stairs: Yes: External: 4 steps; on right going up Has following equipment at home: Crutches, on occassion  OCCUPATION: [employer], out on leave  PLOF: Independent  with basic ADLs, Needs assistance with ADLs, and hired someone to help clean  PATIENT GOALS: walk and stand without pain in right foot, get back to work  NEXT MD VISIT: June 30th  OBJECTIVE:  Note: Objective measures were completed at Evaluation unless otherwise noted.  DIAGNOSTIC FINDINGS: CLINICAL DATA:  Heel pain, chronic surgical planning for plantar fasciitis   EXAM: MR OF THE RIGHT HEEL WITHOUT CONTRAST   TECHNIQUE: Multiplanar, multisequence MR imaging of the right foot was performed. No intravenous contrast was administered.   COMPARISON:  Right foot radiographs dated  January 16, 2023.   FINDINGS: Bones/Joint/Cartilage   No fracture or dislocation. Normal alignment. No joint effusion. No marrow signal abnormality.   Ligaments   The components of the lateral collateral ligament, deltoid ligament, and syndesmotic ligaments appear grossly intact. Lisfranc ligament is intact.   Muscles and Tendons   There is a longitudinal split tear of the peroneal longus and brevis tendons extending from the retromalleolar level through the midfoot with tenosynovitis of the common peroneal tendon sheath. Mild tenosynovitis of the posterior tibialis tendon just below the level of the medial malleolus. Flexor and extensor compartment tendons are intact. Achilles tendon is intact. Muscles are normal.   Plantar fascia   Thickening of the central cord of the plantar fascia with surrounding edema. There is an inferior calcaneal enthesophyte with mild associated edema.   Soft tissue Mild subcutaneous edema at the plantar heel.  Muscles are normal.   IMPRESSION: 1. Acute on chronic plantar fasciitis. 2. Longitudinal split tears of the peroneal longus and brevis tendons with tenosynovitis of the common peroneal tendon sheath. 3. Mild tenosynovitis of the posterior tibialis tendon.  PATIENT SURVEYS:  LEFS 35/80  COGNITION: Overall cognitive status: Within functional limits for tasks assessed     SENSATION: WFL  EDEMA:  On occasion, not at this time  PALPATION: Pt demonstrates tenderness on medial and lateral arch, lateral compartment of lower leg and posterior tib region  LOWER EXTREMITY ROM:  Active ROM Right eval Left eval  Hip flexion    Hip extension    Hip abduction    Hip adduction    Hip internal rotation    Hip external rotation    Knee flexion    Knee extension    Ankle dorsiflexion 9 9  Ankle plantarflexion 55 63  Ankle inversion 40 50  Ankle eversion 28, some pain lateral compartment 20   (Blank rows = not tested)  LOWER  EXTREMITY MMT:  MMT Right eval Left eval  Hip flexion    Hip extension    Hip abduction    Hip adduction    Hip internal rotation    Hip external rotation    Knee flexion    Knee extension    Ankle dorsiflexion 4- 5  Ankle plantarflexion 4-, pain on inside of arch and lateral compartment of leg  4-  Ankle inversion 4 5  Ankle eversion 4- 5   (Blank rows = not tested)  LOWER EXTREMITY SPECIAL TESTS:  Ankle special tests: Great toe extension test: negative  FUNCTIONAL TESTS:  2 minute walk test: 294 feet SLS R: 5.60s L: 26s  GAIT: Distance walked: 294 Assistive device utilized: Pt reaching for handrail on wall several times for off weighting of RLE and None Level of assistance: Complete Independence Comments: Pt presents with increasingly antalgic gait pattern, decreased velocity, decreased stance time on RLE.  TREATMENT DATE:  10/04/23: Standing with bil UE assist:   - BAPs Board, level 3, 2 set of 10 reps, DF/PF, EV/INV, CW/CCW - Heel/Toe raises on incline/decline 2x 10 - Vector stance with 1 UE support 2 sets 5x 5" - Slant board 3x 30" Manual supine scar massage to plantar fascia  10/02/23: - BAPs Board, level 3, 2 set of 10 reps, DF/PF, EV/IV, CW/CCW (increased difficulty with inversion/eversion - Heel/Toe raises 2x 10 - Vector stance with 1 UE support 3x 5" - Slant board 3x 30"  Manual therapy:   - STM: to incision, medial arch, posterior tib and peroneals - Manual achilles and posterior tibialis stretch - Kinesiotape for plantar fascia with 3 strips  09/27/2023  Manual Therapy: -STM: to incision, medial arch, posterior tib and peroneals -Manual achilles and posterior tibialis stretch  Therapeutic Exercise: -Heel/toe raises, 2 sets of 10 reps, seated as last time was too painful -Foot towel scrunches, 2 sets of 10 reps seated, pt cued  for slow and controlled movement -Rocker board DF/PF, 2 sets of 10 reps -BAPs Board, level 3, 1 set of 8 reps clockwise and counter clockwise -BAPs Board, level 3, 1 set of 8 reps, DF/PF, EV/IV, pt reports increase in symptoms with inversion eversion variation -Forward lunges on bosu ball, 1 set of 5 reps better performance going into RLE, pt cued for core activation and upright posture, pt reports R knee pain -[place] fwd/bwd, 2 sets of 30 seconds each variation     Manual in supine position with LE elevated: MFR techniques complete and instructed for self care to scar tissue on incision, STM to peroneal tendons and posterior tib insertion  Gait training- 2 point sequence with good mechanics 225ft   Evaluation: -ROM measured, Strength assessed, HEP prescribed, pt educated on prognosis, findings, and importance of HEP compliance if given.     PATIENT EDUCATION:  Education details: Pt was educated on findings of PT evaluation, prognosis, frequency of therapy visits and rationale, attendance policy, and HEP if given.   Person educated: Patient Education method: Explanation, Verbal cues, and Handouts Education comprehension: verbalized understanding, verbal cues required, and needs further education  HOME EXERCISE PROGRAM: Access Code: [email or web address] URL: [email or web address] Date: 09/04/2023 Prepared by: Armond Bertin  Exercises - Gastroc Stretch on Wall  - 1 x daily - 7 x weekly - 3 sets - 30 hold - Single Leg Stance  - 1 x daily - 7 x weekly - 3 sets - 10 reps - Towel Scrunches  - 1 x daily - 7 x weekly - 3 sets - 10 reps - Arch Lifting  - 1 x daily - 7 x weekly - 3 sets - 10 reps   09/06/23: begin self manual scar tissue massage. Encouraged to walk with [facility]  09/25/23: - Seated Figure 4 Ankle Inversion with Resistance  - 2 x daily - 7 x weekly - 1 sets - 10 reps - 5" hold - Seated Ankle Eversion with Resistance  - 2 x daily - 7 x weekly - 1 sets - 10 reps - 5"  hold ASSESSMENT:  CLINICAL IMPRESSION: Continued focus on Rt LE strength and stability.  Pt also limited today with Lt knee pain; suggested she go to MD if not improving in the next couple days.  Progressed to standing BAPS today with some difficulty with inv/ever.  Very minimal discomfort today, mostly only from Lt knee.  Discontinued kinesiotape but continued with manual, specifically to large mound of scar tissue in arch  near scar.  Therapist able to reduce this and pt reported much improvement following manual at end of session.     Eval:  Patient is a 43 y.o. female who was seen today for physical therapy evaluation and treatment for M72.2 (ICD-10-CM) - Plantar fasciitis of right foot. Patient demonstrates antalgic gait, pain in right foot and lateral compartment of leg during AROM, decreased RLE strength, and impaired balance on RLE. Patient also demonstrates difficulty with ambulation during today's session with decreased stride length and velocity noted due increasing amount of pain during . Patient also demonstrates point tenderness to R foot arch, posterior tib insertion and origin, as well as along peroneal musculature.  Patient would benefit from skilled physical therapy for decreased pain of RLE, increased endurance with ambulation, increased LE strength, and balance for improved gait quality, return to higher level of function with ADLs, and progress towards therapy goals.   OBJECTIVE IMPAIRMENTS: Abnormal gait, decreased activity tolerance, decreased balance, decreased endurance, decreased mobility, difficulty walking, decreased strength, and pain.   ACTIVITY LIMITATIONS: carrying, lifting, bending, standing, squatting, stairs, transfers, and locomotion level  PARTICIPATION LIMITATIONS: meal prep, cleaning, laundry, driving, shopping, community activity, occupation, and yard work  PERSONAL FACTORS: Past/current experiences, Time since onset of injury/illness/exacerbation, and 1  comorbidity: surgery already completed are also affecting patient's functional outcome.   REHAB POTENTIAL: Fair 3 months since surgery  CLINICAL DECISION MAKING: Stable/uncomplicated  EVALUATION COMPLEXITY: Low   GOALS: Goals reviewed with patient? No  SHORT TERM GOALS: Target date: 09/25/23  Patient will demonstrate evidence of independence with individualized HEP and will report compliance for at least 3 days per week for optimized progression towards remaining therapy goals. Baseline: 10/02/23:  Reports compliance with HEP daily. Goal status: MET  2.  Patient will report a decrease in pain level during community ambulation by at least 2 points for improved quality of life. Baseline: 5/10; 10/02/23:  Continues to be limited by pain, increased with standing/walking Goal status: IN PROGRESS     LONG TERM GOALS: Target date: 10/16/23  Pt will demonstrate a an increase of at least 9 points on the LEFS for improved performance of community ambulation and ADL. Baseline: see objective Goal status: INITIAL  2.  Pt will improve 2 MWT by 140 feet in order to demonstrate improved functional ambulatory capacity in community setting.  Baseline: see objective Goal status: INITIAL  3.  Pt will demonstrate WFL pain free ROM (flexion and extension) in right ankle, for increased mobility and maximal efficiency of gait cycle during ambulation. Baseline: see objective Goal status: INITIAL  4.  Pt will demonstrate at least 4/5 MMT for right ankle extremity for increased strength during ADL and community ambulation. Baseline: see objective Goal status: INITIAL  5.  Pt will improve R SLS by 10 seconds in order to improve balance and stability of RLE in comparison to LLE during functional activities. Baseline: see objective Goal status: INITIAL    PLAN:  PT FREQUENCY: 2x/week  PT DURATION: 6 weeks  PLANNED INTERVENTIONS: 97110-Therapeutic exercises, 97530- Therapeutic activity, [id number]-  Neuromuscular re-education, 97535- Self Care, [id number]- Manual therapy, [phone]- Gait training, Patient/Family education, Balance training, Stair training, Joint mobilization, DME instructions, Cryotherapy, and Moist heat  PLAN FOR NEXT SESSION: continue with stability and strengthening.  Update HEP as needed.   Lorenso Romance, PTA/CLT [facility] Ph: [phone]  1:22 PM, 10/04/23

## 2023-10-09 ENCOUNTER — Ambulatory Visit (HOSPITAL_COMMUNITY)

## 2023-10-09 ENCOUNTER — Encounter (HOSPITAL_COMMUNITY): Payer: Self-pay

## 2023-10-09 DIAGNOSIS — M79671 Pain in right foot: Secondary | ICD-10-CM

## 2023-10-09 DIAGNOSIS — M79661 Pain in right lower leg: Secondary | ICD-10-CM

## 2023-10-09 DIAGNOSIS — Z7409 Other reduced mobility: Secondary | ICD-10-CM

## 2023-10-09 NOTE — Therapy (Signed)
 OUTPATIENT PHYSICAL THERAPY LOWER EXTREMITY TREATMENT/PROGRESS NOTE   Progress Note Reporting Period 09/04/23 to 10/09/23  See note below for Objective Data and Assessment of Progress/Goals.     Patient Name: Jeanette Morales MRN: [id number] DOB:Feb 28, 1981, 43 y.o., female Today's Date: 10/09/2023  END OF SESSION:  PT End of Session - 10/09/23 0756     Visit Number 7    Number of Visits 12    Date for PT Re-Evaluation 10/16/23    Authorization Type [employer]    Authorization Time Period no auth required    Authorization - Visit Number 5    Progress Note Due on Visit 10    PT Start Time 0800    PT Stop Time 0840    PT Time Calculation (min) 40 min    Activity Tolerance Patient tolerated treatment well;Patient limited by pain    Behavior During Therapy [facility] for tasks assessed/performed                 Past Medical History:  Diagnosis Date   Hypertension    Palpitations    Past Surgical History:  Procedure Laterality Date   BREAST BIOPSY Right 2015   FOOT SURGERY Left 2019   NO PAST SURGERIES     Patient Active Problem List   Diagnosis Date Noted   ASCUS of cervix with negative high risk HPV 01/16/2023   Boil of vulva 11/14/2019   Encounter for gynecological examination with Papanicolaou smear of cervix 10/27/2019   Encounter for surveillance of contraceptive pills 10/27/2019   Hypertension 10/27/2019   Stress incontinence 09/24/2019   Lichen sclerosus 09/24/2019   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 Pt states if she states that if she is up on her feet more it still gets up to a 7-8/10. Pt states she feels we are making progress with feeling really good after last session. Pt reports getting appointment to pick up custom orthotics, on the 7th to return to work the 8th of July.   Eval:  Pt states she has been dealing with Right heel pain for 10 years, recently when through a partial release of plantar fascia. Pt states it helped a little bit but. Pt states she can not stand or walk long on it with out it hurting or having to sit down. Pt tried therapy 2 times before the surgery. 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 Pt states she literally can barely make it back to the car after work, cries and hobbles into home.  PERTINENT HISTORY: -Left foot surgery 7 years ago, sesamoid removal -lost 65lbs in last year  PAIN:  Are you having pain? Yes: NPRS scale: 5/10 Pain location: arch of right foot,  Pain description: burning, sharp, shooting Aggravating factors: walking, standing Relieving factors: prednisone, ice  PRECAUTIONS: None  RED FLAGS: None   WEIGHT BEARING RESTRICTIONS: No  FALLS:  Has patient fallen in last 6 months? No  LIVING ENVIRONMENT: Lives with: lives with their family Lives in: House/apartment Stairs: Yes: External: 4 steps; on right going up Has following equipment at home: Crutches, on occassion  OCCUPATION: [employer], out on leave  PLOF: Independent with basic ADLs, Needs assistance with ADLs, and hired someone to help clean  PATIENT GOALS: walk and stand without pain in right foot, get back to work  NEXT MD VISIT: June 30th  OBJECTIVE:  Note: Objective measures were completed at Evaluation unless otherwise noted.  DIAGNOSTIC FINDINGS: CLINICAL DATA:  Heel pain, chronic surgical planning  for plantar fasciitis   EXAM: MR OF THE RIGHT HEEL WITHOUT CONTRAST   TECHNIQUE: Multiplanar, multisequence MR imaging of the right foot was performed. No intravenous contrast was administered.   COMPARISON:  Right foot radiographs dated January 16, 2023.   FINDINGS: Bones/Joint/Cartilage   No fracture or dislocation. Normal alignment. No joint effusion. No marrow signal abnormality.   Ligaments   The components of the lateral collateral ligament, deltoid ligament, and syndesmotic ligaments appear grossly intact. Lisfranc ligament is intact.   Muscles and Tendons   There is a longitudinal split tear of the peroneal longus and brevis tendons extending from the retromalleolar level through the midfoot with tenosynovitis of the common peroneal tendon sheath. Mild tenosynovitis of the posterior tibialis tendon just below the level of the medial malleolus. Flexor and extensor compartment tendons are intact. Achilles tendon is intact. Muscles are normal.   Plantar fascia   Thickening of the central cord of the plantar fascia with surrounding edema. There is an inferior calcaneal enthesophyte with mild associated edema.   Soft tissue Mild subcutaneous edema at the plantar heel.  Muscles are normal.   IMPRESSION: 1. Acute on chronic plantar fasciitis. 2. Longitudinal split tears of the peroneal longus and brevis tendons with tenosynovitis of the common peroneal tendon sheath. 3. Mild tenosynovitis of the posterior tibialis tendon.  PATIENT SURVEYS:  LEFS 35/80 LEFS: 62/80  COGNITION: Overall cognitive status: Within functional limits for tasks assessed     SENSATION: WFL  EDEMA:  On occasion, not at this time  PALPATION: Pt demonstrates tenderness on medial and lateral arch, lateral compartment of lower leg and posterior tib region  LOWER EXTREMITY ROM:  Active ROM Right eval Left eval Right 10/09/23  Hip flexion     Hip extension     Hip abduction      Hip adduction     Hip internal rotation     Hip external rotation     Knee flexion     Knee extension     Ankle dorsiflexion 9 9 11   Ankle plantarflexion 55 63 73  Ankle inversion 40 50 41  Ankle eversion 28, some pain lateral compartment 20 26, no pain reported   (Blank rows = not tested)  LOWER EXTREMITY MMT:  MMT Right eval Left eval Right 10/09/23  Hip flexion     Hip extension     Hip abduction     Hip adduction     Hip internal rotation     Hip external rotation     Knee flexion     Knee extension     Ankle dorsiflexion 4- 5 4+  Ankle plantarflexion 4-, pain on inside of arch and lateral compartment of leg  4- 4  Ankle inversion 4 5 4+  Ankle eversion 4- 5 4+   (Blank rows = not tested)  LOWER EXTREMITY SPECIAL TESTS:  Ankle  special tests: Great toe extension test: negative  FUNCTIONAL TESTS:  2 minute walk test: 294 feet 10/09/23: 406 feet, 5/10 pain reported  SLS R: 5.60s L: 26s SLS 10/09/23: R: 30s L: 30s  GAIT: Distance walked: 294 Assistive device utilized: Pt reaching for handrail on wall several times for off weighting of RLE and None Level of assistance: Complete Independence Comments: Pt presents with increasingly antalgic gait pattern, decreased velocity, decreased stance time on RLE.                                                                                                                                TREATMENT DATE:  10/09/2023  Progress Note: , SLS assessed, ROM and strength assessed of R ankle Manual Therapy: -STM: to incision, medial arch -Manual achilles and posterior tibialis stretch, 2 sets of 15 seconds holds  Therapeutic Exercise: -Heel/toe raises, 2 sets of 10 reps, seated as last time was too painful -10 Long sitting ankle circles following manual -Sled Pushes, 3 laps, 40 pounds, pt cued for constant sled movement, pt demonstrates increased symptoms with pushing direction    10/04/23: Standing with bil UE assist:   -  BAPs Board, level 3, 2 set of 10 reps, DF/PF, EV/INV, CW/CCW - Heel/Toe raises on incline/decline 2x 10 - Vector stance with 1 UE support 2 sets 5x 5" - Slant board 3x 30" Manual supine scar massage to plantar fascia  10/02/23: - BAPs Board, level 3, 2 set of 10 reps, DF/PF, EV/IV, CW/CCW (increased difficulty with inversion/eversion - Heel/Toe raises 2x 10 - Vector stance with 1 UE support 3x 5" - Slant board 3x 30"  Manual therapy:   - STM: to incision, medial arch, posterior tib and peroneals - Manual achilles and posterior tibialis stretch - Kinesiotape for plantar fascia with 3 strips  09/27/2023  Manual Therapy: -STM: to incision, medial arch, posterior tib and peroneals -Manual achilles and posterior tibialis stretch  Therapeutic Exercise: -Heel/toe raises, 2 sets of 10 reps, seated as last time was too painful -Foot towel scrunches, 2 sets of 10 reps seated, pt cued for slow and controlled movement -Rocker board DF/PF, 2 sets of 10 reps -BAPs Board, level 3, 1 set of 8 reps clockwise and counter clockwise -BAPs Board, level 3, 1 set of 8 reps, DF/PF, EV/IV, pt reports increase in symptoms with inversion eversion variation -Forward lunges on bosu ball, 1 set of 5 reps better performance going into RLE, pt cued for core activation and upright posture, pt reports R knee pain -[place] fwd/bwd, 2 sets of 30 seconds each variation     PATIENT EDUCATION:  Education details: Pt was educated on findings of PT evaluation, prognosis, frequency of therapy visits and rationale, attendance policy, and HEP if given.   Person educated: Patient Education method: Explanation, Verbal cues, and Handouts Education comprehension: verbalized understanding, verbal cues required, and needs further education  HOME EXERCISE PROGRAM: Access Code: [email or web address] URL: [email or web address]  Date: 09/04/2023 Prepared by: Armond Bertin  Exercises - Gastroc Stretch on Wall  - 1 x daily - 7  x weekly - 3 sets - 30 hold - Single Leg Stance  - 1 x daily - 7 x weekly - 3 sets - 10 reps - Towel Scrunches  - 1 x daily - 7 x weekly - 3 sets - 10 reps - Arch Lifting  - 1 x daily - 7 x weekly - 3 sets - 10 reps   09/06/23: begin self manual scar tissue massage. Encouraged to walk with [facility]  09/25/23: - Seated Figure 4 Ankle Inversion with Resistance  - 2 x daily - 7 x weekly - 1 sets - 10 reps - 5" hold - Seated Ankle Eversion with Resistance  - 2 x daily - 7 x weekly - 1 sets - 10 reps - 5" hold ASSESSMENT:  CLINICAL IMPRESSION: Patient continues to demonstrate pain in right foot with increased activity, decreased RLE strength only in plantarflexion, and decreased gait quality with prolonged walking. Patient has demonstrated improvements with balance in BLE balance, strength, and ROM of R ankle. Pt has also demonstrated decreased pain/tenderness to palpation of incision and peroneal musculature around lateral malleolus. Patient able to progress dynamic balance and core activation exercises today with sled pushes for work related movement, good performance with slight increase in RLE symptoms with pushing movement. Pt continues to demonstrate difficulty with prolonged standing and walking with increased symptom reproduction on bottom lateral right foot, near surgical area. Patient would continue to benefit from skilled physical therapy for increased endurance with ambulation, increased RLE plantarflexion strength, and improved performance with work based activities for improved quality of life, improved return to work potential and continued progress towards therapy goals.   Eval:  Patient is a 43 y.o. female who was seen today for physical therapy evaluation and treatment for M72.2 (ICD-10-CM) - Plantar fasciitis of right foot. Patient demonstrates antalgic gait, pain in right foot and lateral compartment of leg during AROM, decreased RLE strength, and impaired balance on RLE. Patient also  demonstrates difficulty with ambulation during today's session with decreased stride length and velocity noted due increasing amount of pain during . Patient also demonstrates point tenderness to R foot arch, posterior tib insertion and origin, as well as along peroneal musculature.  Patient would benefit from skilled physical therapy for decreased pain of RLE, increased endurance with ambulation, increased LE strength, and balance for improved gait quality, return to higher level of function with ADLs, and progress towards therapy goals.   OBJECTIVE IMPAIRMENTS: Abnormal gait, decreased activity tolerance, decreased balance, decreased endurance, decreased mobility, difficulty walking, decreased strength, and pain.   ACTIVITY LIMITATIONS: carrying, lifting, bending, standing, squatting, stairs, transfers, and locomotion level  PARTICIPATION LIMITATIONS: meal prep, cleaning, laundry, driving, shopping, community activity, occupation, and yard work  PERSONAL FACTORS: Past/current experiences, Time since onset of injury/illness/exacerbation, and 1 comorbidity: surgery already completed are also affecting patient's functional outcome.   REHAB POTENTIAL: Fair 3 months since surgery  CLINICAL DECISION MAKING: Stable/uncomplicated  EVALUATION COMPLEXITY: Low   GOALS: Goals reviewed with patient? No  SHORT TERM GOALS: Target date: 09/25/23  Patient will demonstrate evidence of independence with individualized HEP and will report compliance for at least 3 days per week for optimized progression towards remaining therapy goals. Baseline: 10/02/23:  Reports compliance with HEP daily. Goal status: MET  2.  Patient will report a decrease in pain level during community ambulation by at least 2 points for  improved quality of life. Baseline: 5/10; 10/02/23:  Continues to be limited by pain, increased with standing/walking Goal status: IN PROGRESS     LONG TERM GOALS: Target date: 10/16/23  Pt will  demonstrate a an increase of at least 9 points on the LEFS for improved performance of community ambulation and ADL. Baseline: see objective Goal status: MET  2.  Pt will improve 2 MWT by 140 feet in order to demonstrate improved functional ambulatory capacity in community setting.  Baseline: see objective Goal status: IN PROGRESS  3.  Pt will demonstrate WFL pain free ROM (flexion and extension) in right ankle, for increased mobility and maximal efficiency of gait cycle during ambulation. Baseline: see objective Goal status: MET  4.  Pt will demonstrate at least 4/5 MMT for right ankle extremity for increased strength during ADL and community ambulation. Baseline: see objective Goal status: IN PROGRESS  5.  Pt will improve R SLS by 10 seconds in order to improve balance and stability of RLE in comparison to LLE during functional activities. Baseline: see objective Goal status: MET    PLAN:  PT FREQUENCY: 2x/week  PT DURATION: 6 weeks  PLANNED INTERVENTIONS: 97110-Therapeutic exercises, 97530- Therapeutic activity, [id number]- Neuromuscular re-education, 97535- Self Care, [id number]- Manual therapy, [phone]- Gait training, Patient/Family education, Balance training, Stair training, Joint mobilization, DME instructions, Cryotherapy, and Moist heat  PLAN FOR NEXT SESSION: continue with stability and strengthening.  Update HEP as needed. Discuss POC, possibly extending therapy/discharge 6/17.  Armond Bertin, PT, DPT [facility] Office: [phone] 10:24 AM, 10/09/23

## 2023-10-11 ENCOUNTER — Encounter (HOSPITAL_COMMUNITY): Payer: Self-pay

## 2023-10-11 ENCOUNTER — Ambulatory Visit (HOSPITAL_COMMUNITY)

## 2023-10-11 DIAGNOSIS — M79671 Pain in right foot: Secondary | ICD-10-CM | POA: Diagnosis not present

## 2023-10-11 DIAGNOSIS — M79661 Pain in right lower leg: Secondary | ICD-10-CM

## 2023-10-11 DIAGNOSIS — Z7409 Other reduced mobility: Secondary | ICD-10-CM

## 2023-10-11 NOTE — Therapy (Signed)
 OUTPATIENT PHYSICAL THERAPY LOWER EXTREMITY TREATMENT    Patient Name: Jeanette Morales MRN: [id number] DOB:Feb 28, 1981, 43 y.o., female Today's Date: 10/11/2023  END OF SESSION:  PT End of Session - 10/11/23 0801     Visit Number 8    Number of Visits 12    Date for PT Re-Evaluation 10/16/23    Authorization Type [employer]    Authorization Time Period no auth required    Authorization - Visit Number 6    Progress Note Due on Visit 10    PT Start Time 0802    PT Stop Time 0840    PT Time Calculation (min) 38 min    Activity Tolerance Patient tolerated treatment well;Patient limited by pain    Behavior During Therapy [facility] for tasks assessed/performed               Past Medical History:  Diagnosis Date   Hypertension    Palpitations    Past Surgical History:  Procedure Laterality Date   BREAST BIOPSY Right 2015   FOOT SURGERY Left 2019   NO PAST SURGERIES     Patient Active Problem List   Diagnosis Date Noted   ASCUS of cervix with negative high risk HPV 01/16/2023   Boil of vulva 11/14/2019   Encounter for gynecological examination with Papanicolaou smear of cervix 10/27/2019   Encounter for surveillance of contraceptive pills 10/27/2019   Hypertension 10/27/2019   Stress incontinence 09/24/2019   Lichen sclerosus 09/24/2019   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 Pt reports it did not hurt during mowing but was pretty painful following, 5/10 pain rating this morning in arch of foot. Pt states she does feel that she is getting stronger but that the pain is about the same as when she started and has not changed.  Eval:  Pt states she has been dealing with Right heel pain for 10 years, recently when through a partial release of plantar fascia. Pt states it helped a little bit but. Pt states she can not stand or walk long on it with out it hurting or having to sit down. Pt tried therapy 2 times before the surgery. 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 Pt states she literally can barely make it back to the car after work, cries and hobbles into home.  PERTINENT HISTORY: -Left foot surgery 7 years ago, sesamoid removal -lost 65lbs in last year  PAIN:  Are you having pain? Yes: NPRS scale: 5/10 Pain location: arch of right foot,  Pain description: burning, sharp, shooting Aggravating factors: walking, standing Relieving factors: prednisone, ice  PRECAUTIONS: None  RED FLAGS: None   WEIGHT BEARING RESTRICTIONS: No  FALLS:  Has patient fallen in last 6 months? No  LIVING ENVIRONMENT: Lives with: lives with their family Lives in: House/apartment Stairs: Yes: External: 4 steps; on right going  up Has following equipment at home: Crutches, on occassion  OCCUPATION: [employer], out on leave  PLOF: Independent with basic ADLs, Needs assistance with ADLs, and hired someone to help clean  PATIENT GOALS: walk and stand without pain in right foot, get back to work  NEXT MD VISIT: June 30th  OBJECTIVE:  Note: Objective measures were completed at Evaluation unless otherwise noted.  DIAGNOSTIC FINDINGS: CLINICAL DATA:  Heel pain, chronic surgical planning for plantar fasciitis   EXAM: MR OF THE RIGHT HEEL WITHOUT CONTRAST   TECHNIQUE: Multiplanar, multisequence MR imaging of the right foot  was performed. No intravenous contrast was administered.   COMPARISON:  Right foot radiographs dated January 16, 2023.   FINDINGS: Bones/Joint/Cartilage   No fracture or dislocation. Normal alignment. No joint effusion. No marrow signal abnormality.   Ligaments   The components of the lateral collateral ligament, deltoid ligament, and syndesmotic ligaments appear grossly intact. Lisfranc ligament is intact.   Muscles and Tendons   There is a longitudinal split tear of the peroneal longus and brevis tendons extending from the retromalleolar level through the midfoot with tenosynovitis of the common peroneal tendon sheath. Mild tenosynovitis of the posterior tibialis tendon just below the level of the medial malleolus. Flexor and extensor compartment tendons are intact. Achilles tendon is intact. Muscles are normal.   Plantar fascia   Thickening of the central cord of the plantar fascia with surrounding edema. There is an inferior calcaneal enthesophyte with mild associated edema.   Soft tissue Mild subcutaneous edema at the plantar heel.  Muscles are normal.   IMPRESSION: 1. Acute on chronic plantar fasciitis. 2. Longitudinal split tears of the peroneal longus and brevis tendons with tenosynovitis of the common peroneal tendon sheath. 3. Mild tenosynovitis of the posterior tibialis tendon.  PATIENT SURVEYS:  LEFS 35/80 LEFS: 62/80  COGNITION: Overall cognitive status: Within functional limits for tasks assessed     SENSATION: WFL  EDEMA:  On occasion, not at this time  PALPATION: Pt demonstrates tenderness on medial and lateral arch, lateral compartment of lower leg and posterior tib region  LOWER EXTREMITY ROM:  Active ROM Right eval Left eval Right 10/09/23  Hip flexion     Hip extension     Hip abduction     Hip adduction     Hip internal rotation     Hip external rotation     Knee flexion     Knee extension     Ankle dorsiflexion 9 9 11    Ankle plantarflexion 55 63 73  Ankle inversion 40 50 41  Ankle eversion 28, some pain lateral compartment 20 26, no pain reported   (Blank rows = not tested)  LOWER EXTREMITY MMT:  MMT Right eval Left eval Right 10/09/23  Hip flexion     Hip extension     Hip abduction     Hip adduction     Hip internal rotation     Hip external rotation     Knee flexion     Knee extension     Ankle dorsiflexion 4- 5 4+  Ankle plantarflexion 4-, pain on inside of arch and lateral compartment of leg  4- 4  Ankle inversion 4 5 4+  Ankle eversion 4- 5 4+   (Blank rows = not tested)  LOWER EXTREMITY SPECIAL TESTS:  Ankle special tests: Great toe extension test: negative  FUNCTIONAL TESTS:  2 minute walk test: 294 feet 10/09/23: 406 feet, 5/10 pain reported  SLS R: 5.60s L:  26s SLS 10/09/23: R: 30s L: 30s  GAIT: Distance walked: 294 Assistive device utilized: Pt reaching for handrail on wall several times for off weighting of RLE and None Level of assistance: Complete Independence Comments: Pt presents with increasingly antalgic gait pattern, decreased velocity, decreased stance time on RLE.                                                                                                                                TREATMENT DATE:  10/11/2023  Manual Therapy: -STM: to incision, medial arch, posterior tib and peroneals -Manual achilles and posterior tibialis stretch  Therapeutic Exercise: -Stationary bike, 5 minute warm up, no resistance -Step up to SLS on 8 inch steps, 2 sets of 10 reps -Lateral step up and overs, 8 inch step, 1 set of 5 reps bilaterally. -Heel/toe raises, 2 sets of 10 reps, seated as last time was too painful -Bosu ball knee drive stretch, 2 sets of 5 reps -Rocker board DF/PF, 2 sets of 10 reps -[place] fwd/bwd, 2 sets of 30 seconds each variation  10/09/2023  Progress Note: , SLS assessed, ROM and strength assessed of R ankle Manual Therapy: -STM: to  incision, medial arch -Manual achilles and posterior tibialis stretch, 2 sets of 15 seconds holds  Therapeutic Exercise: -Heel/toe raises, 2 sets of 10 reps, seated as last time was too painful -10 Long sitting ankle circles following manual -Sled Pushes, 3 laps, 40 pounds, pt cued for constant sled movement, pt demonstrates increased symptoms with pushing direction    10/04/23: Standing with bil UE assist:   - BAPs Board, level 3, 2 set of 10 reps, DF/PF, EV/INV, CW/CCW - Heel/Toe raises on incline/decline 2x 10 - Vector stance with 1 UE support 2 sets 5x 5 - Slant board 3x 30 Manual supine scar massage to plantar fascia    PATIENT EDUCATION:  Education details: Pt was educated on findings of PT evaluation, prognosis, frequency of therapy visits and rationale, attendance policy, and HEP if given.   Person educated: Patient Education method: Explanation, Verbal cues, and Handouts Education comprehension: verbalized understanding, verbal cues required, and needs further education  HOME EXERCISE PROGRAM: Access Code: [email or web address] URL: [email or web address] Date: 09/04/2023 Prepared by: Armond Bertin  Exercises - Gastroc Stretch on Wall  - 1 x daily - 7 x weekly - 3 sets - 30 hold - Single Leg Stance  - 1 x daily - 7 x weekly - 3 sets - 10 reps - Towel Scrunches  - 1 x daily - 7 x weekly - 3 sets - 10 reps - Arch Lifting  - 1 x daily - 7 x weekly - 3 sets - 10 reps   09/06/23: begin self manual scar tissue massage. Encouraged to walk with [facility]  09/25/23: - Seated Figure 4 Ankle Inversion with Resistance  - 2 x daily - 7 x weekly - 1 sets - 10 reps - 5 hold - Seated Ankle Eversion  with Resistance  - 2 x daily - 7 x weekly - 1 sets - 10 reps - 5 hold  10/11/23: tennis ball given for self mobilization ASSESSMENT:  CLINICAL IMPRESSION: Patient continues to demonstrate increased pain in right foot with prolonged weight bearing, decreased RLE strength, decreased gait  quality and balance. Patient also demonstrates decreased activity tolerance during today's session with squats on bosu ball and treadmill variation. Patient continues to respond well to manual therapy and tennis ball rolling under foot. Patient would continue to benefit from skilled physical therapy for increased endurance with ambulation, increased RLE strength, improved activity tolerance, and improved balance for improved quality of life, improved independence with gait training and continued progress towards therapy goals.    Eval:  Patient is a 43 y.o. female who was seen today for physical therapy evaluation and treatment for M72.2 (ICD-10-CM) - Plantar fasciitis of right foot. Patient demonstrates antalgic gait, pain in right foot and lateral compartment of leg during AROM, decreased RLE strength, and impaired balance on RLE. Patient also demonstrates difficulty with ambulation during today's session with decreased stride length and velocity noted due increasing amount of pain during . Patient also demonstrates point tenderness to R foot arch, posterior tib insertion and origin, as well as along peroneal musculature.  Patient would benefit from skilled physical therapy for decreased pain of RLE, increased endurance with ambulation, increased LE strength, and balance for improved gait quality, return to higher level of function with ADLs, and progress towards therapy goals.   OBJECTIVE IMPAIRMENTS: Abnormal gait, decreased activity tolerance, decreased balance, decreased endurance, decreased mobility, difficulty walking, decreased strength, and pain.   ACTIVITY LIMITATIONS: carrying, lifting, bending, standing, squatting, stairs, transfers, and locomotion level  PARTICIPATION LIMITATIONS: meal prep, cleaning, laundry, driving, shopping, community activity, occupation, and yard work  PERSONAL FACTORS: Past/current experiences, Time since onset of injury/illness/exacerbation, and 1 comorbidity:  surgery already completed are also affecting patient's functional outcome.   REHAB POTENTIAL: Fair 3 months since surgery  CLINICAL DECISION MAKING: Stable/uncomplicated  EVALUATION COMPLEXITY: Low   GOALS: Goals reviewed with patient? No  SHORT TERM GOALS: Target date: 09/25/23  Patient will demonstrate evidence of independence with individualized HEP and will report compliance for at least 3 days per week for optimized progression towards remaining therapy goals. Baseline: 10/02/23:  Reports compliance with HEP daily. Goal status: MET  2.  Patient will report a decrease in pain level during community ambulation by at least 2 points for improved quality of life. Baseline: 5/10; 10/02/23:  Continues to be limited by pain, increased with standing/walking Goal status: IN PROGRESS     LONG TERM GOALS: Target date: 10/16/23  Pt will demonstrate a an increase of at least 9 points on the LEFS for improved performance of community ambulation and ADL. Baseline: see objective Goal status: MET  2.  Pt will improve 2 MWT by 140 feet in order to demonstrate improved functional ambulatory capacity in community setting.  Baseline: see objective Goal status: IN PROGRESS  3.  Pt will demonstrate WFL pain free ROM (flexion and extension) in right ankle, for increased mobility and maximal efficiency of gait cycle during ambulation. Baseline: see objective Goal status: MET  4.  Pt will demonstrate at least 4/5 MMT for right ankle extremity for increased strength during ADL and community ambulation. Baseline: see objective Goal status: IN PROGRESS  5.  Pt will improve R SLS by 10 seconds in order to improve balance and stability of RLE in comparison to LLE  during functional activities. Baseline: see objective Goal status: MET    PLAN:  PT FREQUENCY: 2x/week  PT DURATION: 6 weeks  PLANNED INTERVENTIONS: 97110-Therapeutic exercises, 97530- Therapeutic activity, [id number]- Neuromuscular  re-education, 97535- Self Care, [id number]- Manual therapy, [phone]- Gait training, Patient/Family education, Balance training, Stair training, Joint mobilization, DME instructions, Cryotherapy, and Moist heat  PLAN FOR NEXT SESSION: continue with stability and strengthening.  Update HEP as needed. Discuss POC, possibly extending therapy/discharge 6/17 end of cert.  Armond Bertin, PT, DPT [facility] Office: [phone] 8:58 AM, 10/11/23

## 2023-10-16 ENCOUNTER — Ambulatory Visit (HOSPITAL_COMMUNITY)

## 2023-10-16 ENCOUNTER — Encounter (HOSPITAL_COMMUNITY): Payer: Self-pay

## 2023-10-16 DIAGNOSIS — M79671 Pain in right foot: Secondary | ICD-10-CM | POA: Diagnosis not present

## 2023-10-16 DIAGNOSIS — Z7409 Other reduced mobility: Secondary | ICD-10-CM

## 2023-10-16 DIAGNOSIS — M79661 Pain in right lower leg: Secondary | ICD-10-CM

## 2023-10-16 NOTE — Therapy (Addendum)
 OUTPATIENT PHYSICAL THERAPY LOWER EXTREMITY TREATMENT    Patient Name: Jeanette Morales MRN: [id number] DOB:05-Aug-1980, 43 y.o., female Today's Date: 10/16/2023  END OF SESSION:  PT End of Session - 10/16/23 0804     Visit Number 9    Number of Visits 12    Date for PT Re-Evaluation 10/16/23    Authorization Type [employer]    Authorization Time Period no auth required    Progress Note Due on Visit 10    PT Start Time 0804    PT Stop Time 0842    PT Time Calculation (min) 38 min    Activity Tolerance Patient tolerated treatment well;Patient limited by pain    Behavior During Therapy [facility] for tasks assessed/performed               Past Medical History:  Diagnosis Date   Hypertension    Palpitations    Past Surgical History:  Procedure Laterality Date   BREAST BIOPSY Right 2015   FOOT SURGERY Left 2019   NO PAST SURGERIES     Patient Active Problem List   Diagnosis Date Noted   ASCUS of cervix with negative high risk HPV 01/16/2023   Boil of vulva 11/14/2019   Encounter for gynecological examination with Papanicolaou smear of cervix 10/27/2019   Encounter for surveillance of contraceptive pills 10/27/2019   Hypertension 10/27/2019   Stress incontinence 09/24/2019   Lichen sclerosus 09/24/2019   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  Stated foot feels just like  before surgery.  Pt feels she has improved strength but continues to be limited by pain.  Returns to MD 10/29/23.  Has apt for new orthopedic support on 11/05/23.  Eval:  Pt states she has been dealing with Right heel pain for 10 years, recently when through a partial release of plantar fascia. Pt states it helped a little bit but. Pt states she can not stand or walk long on it with out it hurting or having to sit down. Pt tried therapy 2 times before the surgery. 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 Pt states she literally can barely make it back to the car after work, cries and hobbles into home.  PERTINENT HISTORY: -Left foot surgery 7 years ago, sesamoid removal -lost 65lbs in last year  PAIN:  Are you having pain? Yes: NPRS scale: 5/10 Pain location: arch of right foot,  Pain description: burning, sharp, shooting Aggravating factors: walking, standing Relieving factors: prednisone, ice  PRECAUTIONS: None  RED FLAGS: None   WEIGHT BEARING RESTRICTIONS: No  FALLS:  Has patient fallen in last 6 months? No  LIVING ENVIRONMENT: Lives with: lives with their family Lives in: House/apartment Stairs: Yes: External: 4 steps; on right going up Has following equipment at home: Crutches, on occassion  OCCUPATION: [employer], out on leave  PLOF: Independent with basic  ADLs, Needs assistance with ADLs, and hired someone to help clean  PATIENT GOALS: walk and stand without pain in right foot, get back to work  NEXT MD VISIT: June 30th  OBJECTIVE:  Note: Objective measures were completed at Evaluation unless otherwise noted.  DIAGNOSTIC FINDINGS: CLINICAL DATA:  Heel pain, chronic surgical planning for plantar fasciitis   EXAM: MR OF THE RIGHT HEEL WITHOUT CONTRAST   TECHNIQUE: Multiplanar, multisequence MR imaging of the right foot was performed. No intravenous contrast was administered.   COMPARISON:  Right foot radiographs dated  January 16, 2023.   FINDINGS: Bones/Joint/Cartilage   No fracture or dislocation. Normal alignment. No joint effusion. No marrow signal abnormality.   Ligaments   The components of the lateral collateral ligament, deltoid ligament, and syndesmotic ligaments appear grossly intact. Lisfranc ligament is intact.   Muscles and Tendons   There is a longitudinal split tear of the peroneal longus and brevis tendons extending from the retromalleolar level through the midfoot with tenosynovitis of the common peroneal tendon sheath. Mild tenosynovitis of the posterior tibialis tendon just below the level of the medial malleolus. Flexor and extensor compartment tendons are intact. Achilles tendon is intact. Muscles are normal.   Plantar fascia   Thickening of the central cord of the plantar fascia with surrounding edema. There is an inferior calcaneal enthesophyte with mild associated edema.   Soft tissue Mild subcutaneous edema at the plantar heel.  Muscles are normal.   IMPRESSION: 1. Acute on chronic plantar fasciitis. 2. Longitudinal split tears of the peroneal longus and brevis tendons with tenosynovitis of the common peroneal tendon sheath. 3. Mild tenosynovitis of the posterior tibialis tendon.  PATIENT SURVEYS:  LEFS 35/80 LEFS: 62/80  COGNITION: Overall cognitive status: Within functional limits for tasks assessed     SENSATION: WFL  EDEMA:  On occasion, not at this time  PALPATION: Pt demonstrates tenderness on medial and lateral arch, lateral compartment of lower leg and posterior tib region  LOWER EXTREMITY ROM:  Active ROM Right eval Left eval Right 10/09/23  Hip flexion     Hip extension     Hip abduction     Hip adduction     Hip internal rotation     Hip external rotation     Knee flexion     Knee extension     Ankle dorsiflexion 9 9 11   Ankle plantarflexion 55 63 73  Ankle inversion 40 50 41  Ankle eversion 28, some pain lateral compartment  20 26, no pain reported   (Blank rows = not tested)  LOWER EXTREMITY MMT:  MMT Right eval Left eval Right 10/09/23  Hip flexion     Hip extension     Hip abduction     Hip adduction     Hip internal rotation     Hip external rotation     Knee flexion     Knee extension     Ankle dorsiflexion 4- 5 4+  Ankle plantarflexion 4-, pain on inside of arch and lateral compartment of leg  4- 4  Ankle inversion 4 5 4+  Ankle eversion 4- 5 4+   (Blank rows = not tested)  LOWER EXTREMITY SPECIAL TESTS:  Ankle special tests: Great toe extension test: negative  FUNCTIONAL TESTS:  2 minute walk test: 294 feet 10/09/23: 406 feet, 5/10 pain reported  SLS R: 5.60s L: 26s SLS 10/09/23: R: 30s L: 30s  GAIT: Distance walked: 294 Assistive device utilized: Pt reaching for handrail on  wall several times for off weighting of RLE and None Level of assistance: Complete Independence Comments: Pt presents with increasingly antalgic gait pattern, decreased velocity, decreased stance time on RLE.                                                                                                                                TREATMENT DATE:  10/16/23: - Heel and toe raises 2x 10 - Sit to stand to heel raise 10x - SLS Rt 24 - 497ft, pain scale 5-6/10 following with increased pain per step - Manual therapy supine with LE elevated:   MFR to plantar fascia with increased focus on lateral calcaneus, STM to posterior tib -Slant board soleus and gastroc 3x 30  10/11/2023  Manual Therapy: -STM: to incision, medial arch, posterior tib and peroneals -Manual achilles and posterior tibialis stretch  Therapeutic Exercise: -Stationary bike, 5 minute warm up, no resistance -Step up to SLS on 8 inch steps, 2 sets of 10 reps -Lateral step up and overs, 8 inch step, 1 set of 5 reps bilaterally. -Heel/toe raises, 2 sets of 10 reps, seated as last time was too painful -Bosu ball knee drive stretch, 2 sets of  5 reps -Rocker board DF/PF, 2 sets of 10 reps -[place] fwd/bwd, 2 sets of 30 seconds each variation  10/09/2023  Progress Note: , SLS assessed, ROM and strength assessed of R ankle Manual Therapy: -STM: to incision, medial arch -Manual achilles and posterior tibialis stretch, 2 sets of 15 seconds holds  Therapeutic Exercise: -Heel/toe raises, 2 sets of 10 reps, seated as last time was too painful -10 Long sitting ankle circles following manual -Sled Pushes, 3 laps, 40 pounds, pt cued for constant sled movement, pt demonstrates increased symptoms with pushing direction    10/04/23: Standing with bil UE assist:   - BAPs Board, level 3, 2 set of 10 reps, DF/PF, EV/INV, CW/CCW - Heel/Toe raises on incline/decline 2x 10 - Vector stance with 1 UE support 2 sets 5x 5 - Slant board 3x 30 Manual supine scar massage to plantar fascia    PATIENT EDUCATION:  Education details: Pt was educated on findings of PT evaluation, prognosis, frequency of therapy visits and rationale, attendance policy, and HEP if given.   Person educated: Patient Education method: Explanation, Verbal cues, and Handouts Education comprehension: verbalized understanding, verbal cues required, and needs further education  HOME EXERCISE PROGRAM: Access Code: [email or web address] URL: [email or web address] Date: 09/04/2023 Prepared by: Lang Ada  Exercises - Gastroc Stretch on Wall  - 1 x daily - 7 x weekly - 3 sets - 30 hold - Single Leg Stance  - 1 x daily - 7 x weekly - 3 sets - 10 reps - Towel Scrunches  - 1 x daily - 7 x weekly - 3 sets - 10 reps - Arch Lifting  - 1 x daily - 7 x weekly - 3 sets - 10 reps   09/06/23:  begin self manual scar tissue massage. Encouraged to walk with [facility]  09/25/23: - Seated Figure 4 Ankle Inversion with Resistance  - 2 x daily - 7 x weekly - 1 sets - 10 reps - 5 hold - Seated Ankle Eversion with Resistance  - 2 x daily - 7 x weekly - 1 sets - 10 reps - 5  hold  10/11/23: tennis ball given for self mobilization ASSESSMENT:  CLINICAL IMPRESSION: Began session discussing continuing therapy vs DC.  Pt continues to be limited by pain with weight bearing.  Noted myofascial restrictions with increased pain during palpation.  Continue 4 more sessions with focus on pain control, try dry needling to address fascial and musculature restrictions.   Eval:  Patient is a 43 y.o. female who was seen today for physical therapy evaluation and treatment for M72.2 (ICD-10-CM) - Plantar fasciitis of right foot. Patient demonstrates antalgic gait, pain in right foot and lateral compartment of leg during AROM, decreased RLE strength, and impaired balance on RLE. Patient also demonstrates difficulty with ambulation during today's session with decreased stride length and velocity noted due increasing amount of pain during . Patient also demonstrates point tenderness to R foot arch, posterior tib insertion and origin, as well as along peroneal musculature.  Patient would benefit from skilled physical therapy for decreased pain of RLE, increased endurance with ambulation, increased LE strength, and balance for improved gait quality, return to higher level of function with ADLs, and progress towards therapy goals.   OBJECTIVE IMPAIRMENTS: Abnormal gait, decreased activity tolerance, decreased balance, decreased endurance, decreased mobility, difficulty walking, decreased strength, and pain.   ACTIVITY LIMITATIONS: carrying, lifting, bending, standing, squatting, stairs, transfers, and locomotion level  PARTICIPATION LIMITATIONS: meal prep, cleaning, laundry, driving, shopping, community activity, occupation, and yard work  PERSONAL FACTORS: Past/current experiences, Time since onset of injury/illness/exacerbation, and 1 comorbidity: surgery already completed are also affecting patient's functional outcome.   REHAB POTENTIAL: Fair 3 months since surgery  CLINICAL  DECISION MAKING: Stable/uncomplicated  EVALUATION COMPLEXITY: Low   GOALS: Goals reviewed with patient? No  SHORT TERM GOALS: Target date: 09/25/23  Patient will demonstrate evidence of independence with individualized HEP and will report compliance for at least 3 days per week for optimized progression towards remaining therapy goals. Baseline: 10/02/23:  Reports compliance with HEP daily. Goal status: MET  2.  Patient will report a decrease in pain level during community ambulation by at least 2 points for improved quality of life. Baseline: 5/10; 10/02/23:  Continues to be limited by pain, increased with standing/walking Goal status: IN PROGRESS     LONG TERM GOALS: Target date: 10/16/23  Pt will demonstrate a an increase of at least 9 points on the LEFS for improved performance of community ambulation and ADL. Baseline: see objective Goal status: MET  2.  Pt will improve 2 MWT by 140 feet in order to demonstrate improved functional ambulatory capacity in community setting.  Baseline: see objective; 10/16/23:  increased cadence by 123 ft with increased pain following gait. Goal status: IN PROGRESS  3.  Pt will demonstrate WFL pain free ROM (flexion and extension) in right ankle, for increased mobility and maximal efficiency of gait cycle during ambulation. Baseline: see objective Goal status: MET  4.  Pt will demonstrate at least 4/5 MMT for right ankle extremity for increased strength during ADL and community ambulation. Baseline: see objective Goal status: IN PROGRESS  5.  Pt will improve R SLS by 10 seconds in order to improve  balance and stability of RLE in comparison to LLE during functional activities. Baseline: see objective Goal status: MET    PLAN:  PT FREQUENCY: 1x/week  PT DURATION: 4 weeks  PLANNED INTERVENTIONS: 97110-Therapeutic exercises, 97530- Therapeutic activity, [id number]- Neuromuscular re-education, 97535- Self Care, [id number]- Manual therapy, [phone]- Gait  training, Patient/Family education, Balance training, Stair training, Joint mobilization, DME instructions, Cryotherapy, Moist heat, and Dry needling  PLAN FOR NEXT SESSION: Continue 4 more session with focus on dry needling for pain control.  Augustin Mclean, LPTA/CLT; CBIS [phone]  9:43 AM, 10/16/23  Lang Ada, PT, DPT [facility] Office: [phone] 5:42 PM, 10/18/23

## 2023-10-18 ENCOUNTER — Encounter (HOSPITAL_COMMUNITY)

## 2023-10-18 NOTE — Addendum Note (Signed)
 Addended by: Flynt Breeze on: 10/18/2023 05:45 PM   Modules accepted: Orders

## 2023-10-21 ENCOUNTER — Other Ambulatory Visit: Payer: Self-pay | Admitting: Adult Health

## 2023-10-29 ENCOUNTER — Telehealth: Payer: Self-pay | Admitting: Podiatry

## 2023-10-29 ENCOUNTER — Encounter: Payer: Self-pay | Admitting: Podiatry

## 2023-10-29 ENCOUNTER — Ambulatory Visit (INDEPENDENT_AMBULATORY_CARE_PROVIDER_SITE_OTHER): Admitting: Podiatry

## 2023-10-29 DIAGNOSIS — M722 Plantar fascial fibromatosis: Secondary | ICD-10-CM

## 2023-10-29 MED ORDER — TRIAMCINOLONE ACETONIDE 10 MG/ML IJ SUSP
2.5000 mg | Freq: Once | INTRAMUSCULAR | Status: AC
Start: 2023-10-29 — End: 2023-10-29
  Administered 2023-10-29: 2.5 mg via INTRA_ARTICULAR

## 2023-10-29 MED ORDER — DEXAMETHASONE SODIUM PHOSPHATE 120 MG/30ML IJ SOLN
4.0000 mg | Freq: Once | INTRAMUSCULAR | Status: AC
Start: 2023-10-29 — End: 2023-10-29
  Administered 2023-10-29: 4 mg via INTRA_ARTICULAR

## 2023-10-29 NOTE — Progress Notes (Signed)
  Subjective:  Patient ID: Jeanette Morales, female    DOB: [birth year],  MRN: [id number]  Chief Complaint  Patient presents with   Post-op Problem    Post op Plantar fasciitis of right foot . 4 pain. Non diabetic.    DOS: 06/26/23  Procedure: Right plantar fasciotomy   43 y.o. female returns for POV#5. Relates doing ok still getting some pain has been in PT. Will be starting dry needling soon.   Review of Systems: Negative except as noted in the HPI. Denies N/V/F/Ch.  Past Medical History:  Diagnosis Date   Hypertension    Palpitations     Current Outpatient Medications:    DULoxetine (CYMBALTA) 30 MG capsule, Take 30 mg by mouth daily., Disp: , Rfl:    JUNEL  1/20 1-20 MG-MCG tablet, Take 1 tablet by mouth daily., Disp: 84 tablet, Rfl: 4   losartan -hydrochlorothiazide (HYZAAR) 50-12.5 MG tablet, TAKE 1 TABLET BY MOUTH EVERY DAY, Disp: 90 tablet, Rfl: 3   meloxicam  (MOBIC ) 15 MG tablet, Take 1 tablet (15 mg total) by mouth daily., Disp: 30 tablet, Rfl: 0   methylPREDNISolone  (MEDROL  DOSEPAK) 4 MG TBPK tablet, Take as directed, Disp: 21 tablet, Rfl: 0   ondansetron  (ZOFRAN ) 4 MG tablet, Take 1 tablet (4 mg total) by mouth every 8 (eight) hours as needed for nausea or vomiting., Disp: 20 tablet, Rfl: 0   UNABLE TO FIND, Ozempic compound-weekly, Disp: , Rfl:   Social History   Tobacco Use  Smoking Status Never  Smokeless Tobacco Never    Allergies  Allergen Reactions   Lexapro [Escitalopram Oxalate]     agitation   Objective:  There were no vitals filed for this visit. There is no height or weight on file to calculate BMI. Constitutional Well developed. Well nourished.  Vascular Foot warm and well perfused. Capillary refill normal to all digits.   Neurologic Normal speech. Oriented to person, place, and time. Epicritic sensation to light touch grossly present bilaterally.  Dermatologic Skin healing well without signs of infection. Skin edges well coapted without signs of  infection.  Orthopedic: Tenderness to palpation noted about the surgical site.    Assessment:   1. Plantar fasciitis of right foot       Plan:  Patient was evaluated and treated and all questions answered.  S/p foot surgery right -Progressing as expected post-operatively. -WB Status: WBAT in regular shoe -Continue PT. Scheduled for dry needling.  -Awaiting orthotics -Injeciton offered today procedure below.  -Medications: n/a   Return in 6 weeks for recheck  Procedure: Injection Tendon/Ligament Discussed alternatives, risks, complications and verbal consent was obtained.  Location: Right plantar fascia . Skin Prep: Alcohol. Injectate: 1cc 0.5% marcaine plain, 1 cc dexamethasone  0.5 cc kenalog   Disposition: Patient tolerated procedure well. Injection site dressed with a band-aid.  Post-injection care was discussed and return precautions discussed.    Return in about 6 weeks (around 12/10/2023) for post op, pf .

## 2023-10-29 NOTE — Telephone Encounter (Signed)
 Recd forms again. Revised RTW 12/18/23. Faxed to [phone]

## 2023-11-05 ENCOUNTER — Ambulatory Visit

## 2023-11-05 NOTE — Progress Notes (Signed)
 Patient presents today to pick up custom molded foot orthotics, diagnosed with PF and surgical procedure by Dr. Sikora.   Orthotics were dispensed and fit was satisfactory. Reviewed instructions for break-in and wear. Written instructions given to patient.  Patient will follow up as needed.   Lolita Schultze CPed, CFo, CFm

## 2023-11-07 ENCOUNTER — Ambulatory Visit (HOSPITAL_COMMUNITY): Attending: Podiatry

## 2023-11-07 DIAGNOSIS — M79661 Pain in right lower leg: Secondary | ICD-10-CM | POA: Diagnosis present

## 2023-11-07 DIAGNOSIS — M79671 Pain in right foot: Secondary | ICD-10-CM | POA: Diagnosis present

## 2023-11-07 DIAGNOSIS — Z7409 Other reduced mobility: Secondary | ICD-10-CM | POA: Diagnosis present

## 2023-11-07 NOTE — Therapy (Signed)
 OUTPATIENT PHYSICAL THERAPY LOWER EXTREMITY TREATMENT    Patient Name: Jeanette Morales MRN: [id number] DOB:[birth year], 43 y.o., female Today's Date: 11/07/2023  END OF SESSION:  PT End of Session - 11/07/23 0723     Visit Number 10    Number of Visits 12    Date for PT Re-Evaluation 10/16/23    Authorization Type [employer]    Authorization Time Period no auth required    Progress Note Due on Visit 10    PT Start Time 0719    PT Stop Time 0800    PT Time Calculation (min) 41 min    Activity Tolerance Patient tolerated treatment well;Patient limited by pain    Behavior During Therapy [facility] for tasks assessed/performed               Past Medical History:  Diagnosis Date   Hypertension    Palpitations    Past Surgical History:  Procedure Laterality Date   BREAST BIOPSY Right 2015   FOOT SURGERY Left 2019   NO PAST SURGERIES     Patient Active Problem List   Diagnosis Date Noted   ASCUS of cervix with negative high risk HPV 01/16/2023   Boil of vulva 11/14/2019   Encounter for gynecological examination with Papanicolaou smear of cervix 10/27/2019   Encounter for surveillance of contraceptive pills 10/27/2019   Hypertension 10/27/2019   Stress incontinence 09/24/2019   Lichen sclerosus 09/24/2019   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   OAB (overactive bladder) 09/24/2019   Elevated BP without diagnosis of hypertension 09/24/2019   Vulvar irritation 09/11/2019    PCP: Toribio Jerel MATSU, MD   REFERRING PROVIDER: Joya Stabs, DPM  REFERRING DIAG: M72.2 (ICD-10-CM) - Plantar fasciitis of right foot  THERAPY DIAG:  Pain in right foot  Pain in right lower leg  Impaired functional mobility, balance, gait, and endurance  Rationale for Evaluation and Treatment: Rehabilitation  ONSET DATE: 06/26/23  SUBJECTIVE:   SUBJECTIVE STATEMENT: Returns today to try dry needling; maybe 15% better overall; Saw MD 10/29/23 and had a shot put in and that  felt better for 2 or 3 days but then came back.  Returns to her in 6 weeks.  4/10 pain on arrival today.  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 She does put inserts in her boots just picked up new ones this week.     Eval:  Pt states she has been dealing with Right heel pain for 10 years, recently when through a partial release of plantar fascia. Pt states it helped a little bit but. Pt states she can not stand or walk long on it with out it hurting or having to sit down. Pt tried therapy 2 times before the surgery. 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 Pt states she literally can barely make it back to the car after work, cries and hobbles into home.  PERTINENT HISTORY: -Left foot surgery 7 years ago, sesamoid removal -lost 65lbs in last year  PAIN:  Are you having pain? Yes: NPRS scale: 5/10 Pain location: arch of right foot,  Pain description: burning, sharp, shooting Aggravating factors: walking, standing Relieving factors: prednisone, ice  PRECAUTIONS: None  RED FLAGS: None   WEIGHT BEARING RESTRICTIONS: No  FALLS:  Has patient fallen in last 6 months? No  LIVING ENVIRONMENT: Lives with: lives with their family Lives in: House/apartment Stairs: Yes: External: 4 steps; on  right going up Has following equipment at home: Crutches, on occassion  OCCUPATION: [employer], out on leave  PLOF: Independent with basic ADLs, Needs assistance with ADLs, and hired someone to help clean  PATIENT GOALS: walk and stand without pain in right foot, get back to work  NEXT MD VISIT: June 30th  OBJECTIVE:  Note: Objective measures were completed at Evaluation unless otherwise noted.  DIAGNOSTIC FINDINGS: CLINICAL DATA:  Heel pain, chronic surgical planning for plantar fasciitis   EXAM: MR OF THE RIGHT HEEL WITHOUT CONTRAST   TECHNIQUE: Multiplanar, multisequence MR imaging of the right foot was performed. No intravenous  contrast was administered.   COMPARISON:  Right foot radiographs dated January 16, 2023.   FINDINGS: Bones/Joint/Cartilage   No fracture or dislocation. Normal alignment. No joint effusion. No marrow signal abnormality.   Ligaments   The components of the lateral collateral ligament, deltoid ligament, and syndesmotic ligaments appear grossly intact. Lisfranc ligament is intact.   Muscles and Tendons   There is a longitudinal split tear of the peroneal longus and brevis tendons extending from the retromalleolar level through the midfoot with tenosynovitis of the common peroneal tendon sheath. Mild tenosynovitis of the posterior tibialis tendon just below the level of the medial malleolus. Flexor and extensor compartment tendons are intact. Achilles tendon is intact. Muscles are normal.   Plantar fascia   Thickening of the central cord of the plantar fascia with surrounding edema. There is an inferior calcaneal enthesophyte with mild associated edema.   Soft tissue Mild subcutaneous edema at the plantar heel.  Muscles are normal.   IMPRESSION: 1. Acute on chronic plantar fasciitis. 2. Longitudinal split tears of the peroneal longus and brevis tendons with tenosynovitis of the common peroneal tendon sheath. 3. Mild tenosynovitis of the posterior tibialis tendon.  PATIENT SURVEYS:  LEFS 35/80 LEFS: 62/80  COGNITION: Overall cognitive status: Within functional limits for tasks assessed     SENSATION: WFL  EDEMA:  On occasion, not at this time  PALPATION: Pt demonstrates tenderness on medial and lateral arch, lateral compartment of lower leg and posterior tib region  LOWER EXTREMITY ROM:  Active ROM Right eval Left eval Right 10/09/23 Right 11/07/23  Hip flexion      Hip extension      Hip abduction      Hip adduction      Hip internal rotation      Hip external rotation      Knee flexion      Knee extension      Ankle dorsiflexion 9 9 11 8   Ankle  plantarflexion 55 63 73 54  Ankle inversion 40 50 41 34  Ankle eversion 28, some pain lateral compartment 20 26, no pain reported 14* hurting laterally today   (Blank rows = not tested)  LOWER EXTREMITY MMT:  MMT Right eval Left eval Right 10/09/23 Right 11/07/23  Hip flexion      Hip extension      Hip abduction      Hip adduction      Hip internal rotation      Hip external rotation      Knee flexion      Knee extension      Ankle dorsiflexion 4- 5 4+ 4+  Ankle plantarflexion 4-, pain on inside of arch and lateral compartment of leg  4- 4 4-  Ankle inversion 4 5 4+ 4  Ankle eversion 4- 5 4+ 4   (Blank rows = not tested)  LOWER  EXTREMITY SPECIAL TESTS:  Ankle special tests: Great toe extension test: negative  FUNCTIONAL TESTS:  2 minute walk test: 294 feet 10/09/23: 406 feet, 5/10 pain reported  SLS R: 5.60s L: 26s SLS 10/09/23: R: 30s L: 30s  GAIT: Distance walked: 294 Assistive device utilized: Pt reaching for handrail on wall several times for off weighting of RLE and None Level of assistance: Complete Independence Comments: Pt presents with increasingly antalgic gait pattern, decreased velocity, decreased stance time on RLE.                                                                                                                                TREATMENT DATE:  11/07/23 Progress note SLS 30 LEFS 57/80 71.3% AROM and MMT's see above Trigger Point Dry Needling  Initial Treatment: Pt instructed on Dry Needling rational, procedures, and possible side effects. Pt instructed to expect mild to moderate muscle soreness later in the day and/or into the next day.  Pt instructed in methods to reduce muscle soreness. Pt instructed to continue prescribed HEP. Patient was educated on signs and symptoms of infection and other risk factors and advised to seek medical attention should they occur.  Patient verbalized understanding of these instructions and education.    Patient Verbal Consent Given: Yes Education Handout Provided: Yes Muscles Treated: right gastroc, plantar fascia Electrical Stimulation Performed: No Treatment Response/Outcome: soreness; twitch right calf    10/16/23: - Heel and toe raises 2x 10 - Sit to stand to heel raise 10x - SLS Rt 24 - 453ft, pain scale 5-6/10 following with increased pain per step - Manual therapy supine with LE elevated:   MFR to plantar fascia with increased focus on lateral calcaneus, STM to posterior tib -Slant board soleus and gastroc 3x 30  10/11/2023  Manual Therapy: -STM: to incision, medial arch, posterior tib and peroneals -Manual achilles and posterior tibialis stretch  Therapeutic Exercise: -Stationary bike, 5 minute warm up, no resistance -Step up to SLS on 8 inch steps, 2 sets of 10 reps -Lateral step up and overs, 8 inch step, 1 set of 5 reps bilaterally. -Heel/toe raises, 2 sets of 10 reps, seated as last time was too painful -Bosu ball knee drive stretch, 2 sets of 5 reps -Rocker board DF/PF, 2 sets of 10 reps -[place] fwd/bwd, 2 sets of 30 seconds each variation  10/09/2023  Progress Note: , SLS assessed, ROM and strength assessed of R ankle Manual Therapy: -STM: to incision, medial arch -Manual achilles and posterior tibialis stretch, 2 sets of 15 seconds holds  Therapeutic Exercise: -Heel/toe raises, 2 sets of 10 reps, seated as last time was too painful -10 Long sitting ankle circles following manual -Sled Pushes, 3 laps, 40 pounds, pt cued for constant sled movement, pt demonstrates increased symptoms with pushing direction    10/04/23: Standing with bil UE assist:   - BAPs Board, level 3, 2 set of 10 reps, DF/PF, EV/INV,  CW/CCW - Heel/Toe raises on incline/decline 2x 10 - Vector stance with 1 UE support 2 sets 5x 5 - Slant board 3x 30 Manual supine scar massage to plantar fascia    PATIENT EDUCATION:  Education details: Pt was educated on findings of  PT evaluation, prognosis, frequency of therapy visits and rationale, attendance policy, and HEP if given.   Person educated: Patient Education method: Explanation, Verbal cues, and Handouts Education comprehension: verbalized understanding, verbal cues required, and needs further education  HOME EXERCISE PROGRAM: Access Code: [email or web address] URL: [email or web address] Date: 09/04/2023 Prepared by: Lang Ada  Exercises - Gastroc Stretch on Wall  - 1 x daily - 7 x weekly - 3 sets - 30 hold - Single Leg Stance  - 1 x daily - 7 x weekly - 3 sets - 10 reps - Towel Scrunches  - 1 x daily - 7 x weekly - 3 sets - 10 reps - Arch Lifting  - 1 x daily - 7 x weekly - 3 sets - 10 reps   09/06/23: begin self manual scar tissue massage. Encouraged to walk with [facility]  09/25/23: - Seated Figure 4 Ankle Inversion with Resistance  - 2 x daily - 7 x weekly - 1 sets - 10 reps - 5 hold - Seated Ankle Eversion with Resistance  - 2 x daily - 7 x weekly - 1 sets - 10 reps - 5 hold  10/11/23: tennis ball given for self mobilization ASSESSMENT:  CLINICAL IMPRESSION: Patient returns to try dry needling.  Quick reassess to determine patient current objective status.  Patient continues with pain that limits function and strength.  Mild swelling noted right ankle and medial foot at heel.  Trial of dry needling today with noted tightness in calf laterally and to plantar fascia with twitch in calf; soreness after treatment noted. Instructed patient in how to address soreness and to continue with HEP and stretching.  Patient will benefit from continued skilled therapy services to address deficits and promote return to optimal function.      Eval:  Patient is a 43 y.o. female who was seen today for physical therapy evaluation and treatment for M72.2 (ICD-10-CM) - Plantar fasciitis of right foot. Patient demonstrates antalgic gait, pain in right foot and lateral compartment of leg during AROM, decreased RLE  strength, and impaired balance on RLE. Patient also demonstrates difficulty with ambulation during today's session with decreased stride length and velocity noted due increasing amount of pain during . Patient also demonstrates point tenderness to R foot arch, posterior tib insertion and origin, as well as along peroneal musculature.  Patient would benefit from skilled physical therapy for decreased pain of RLE, increased endurance with ambulation, increased LE strength, and balance for improved gait quality, return to higher level of function with ADLs, and progress towards therapy goals.   OBJECTIVE IMPAIRMENTS: Abnormal gait, decreased activity tolerance, decreased balance, decreased endurance, decreased mobility, difficulty walking, decreased strength, and pain.   ACTIVITY LIMITATIONS: carrying, lifting, bending, standing, squatting, stairs, transfers, and locomotion level  PARTICIPATION LIMITATIONS: meal prep, cleaning, laundry, driving, shopping, community activity, occupation, and yard work  PERSONAL FACTORS: Past/current experiences, Time since onset of injury/illness/exacerbation, and 1 comorbidity: surgery already completed are also affecting patient's functional outcome.   REHAB POTENTIAL: Fair 3 months since surgery  CLINICAL DECISION MAKING: Stable/uncomplicated  EVALUATION COMPLEXITY: Low   GOALS: Goals reviewed with patient? No  SHORT TERM GOALS: Target date: 09/25/23  Patient will demonstrate evidence of independence with individualized HEP and  will report compliance for at least 3 days per week for optimized progression towards remaining therapy goals. Baseline: 10/02/23:  Reports compliance with HEP daily. Goal status: MET  2.  Patient will report a decrease in pain level during community ambulation by at least 2 points for improved quality of life. Baseline: 5/10; 10/02/23:  Continues to be limited by pain, increased with standing/walking Goal status: IN PROGRESS      LONG TERM GOALS: Target date: 10/16/23  Pt will demonstrate a an increase of at least 9 points on the LEFS for improved performance of community ambulation and ADL. Baseline: see objective Goal status: MET  2.  Pt will improve 2 MWT by 140 feet in order to demonstrate improved functional ambulatory capacity in community setting.  Baseline: see objective; 10/16/23:  increased cadence by 123 ft with increased pain following gait. Goal status: IN PROGRESS  3.  Pt will demonstrate WFL pain free ROM (flexion and extension) in right ankle, for increased mobility and maximal efficiency of gait cycle during ambulation. Baseline: see objective Goal status: MET  4.  Pt will demonstrate at least 4/5 MMT for right ankle extremity for increased strength during ADL and community ambulation. Baseline: see objective Goal status: IN PROGRESS  5.  Pt will improve R SLS by 10 seconds in order to improve balance and stability of RLE in comparison to LLE during functional activities. Baseline: see objective Goal status: MET    PLAN:  PT FREQUENCY: 1x/week  PT DURATION: 4 weeks  PLANNED INTERVENTIONS: 97110-Therapeutic exercises, 97530- Therapeutic activity, 97112- Neuromuscular re-education, 97535- Self Care, [id number]- Manual therapy, [phone]- Gait training, Patient/Family education, Balance training, Stair training, Joint mobilization, DME instructions, Cryotherapy, Moist heat, and Dry needling  PLAN FOR NEXT SESSION: Continue 3 more session with focus on dry needling for pain control.  8:02 AM, 11/07/23 Crimson [employer] Small Orlyn Odonoghue MPT [facility] [place] [phone] [phone]

## 2023-11-07 NOTE — Patient Instructions (Signed)

## 2023-11-15 ENCOUNTER — Ambulatory Visit (HOSPITAL_COMMUNITY)

## 2023-11-15 DIAGNOSIS — M79671 Pain in right foot: Secondary | ICD-10-CM | POA: Diagnosis not present

## 2023-11-15 DIAGNOSIS — M79661 Pain in right lower leg: Secondary | ICD-10-CM

## 2023-11-15 DIAGNOSIS — Z7409 Other reduced mobility: Secondary | ICD-10-CM

## 2023-11-15 NOTE — Therapy (Signed)
 OUTPATIENT PHYSICAL THERAPY LOWER EXTREMITY TREATMENT    Patient Name: Jeanette Morales MRN: [id number] DOB:20-Aug-1980, 43 y.o., female Today's Date: 11/15/2023  END OF SESSION:  PT End of Session - 11/15/23 0846     Visit Number 11    Number of Visits 12    Date for PT Re-Evaluation 10/16/23    Authorization Type [employer]    Authorization Time Period no auth required    Progress Note Due on Visit 10    PT Start Time 0846    PT Stop Time 0920    PT Time Calculation (min) 34 min    Activity Tolerance Patient tolerated treatment well;Patient limited by pain    Behavior During Therapy WFL for tasks assessed/performed               Past Medical History:  Diagnosis Date   Hypertension    Palpitations    Past Surgical History:  Procedure Laterality Date   BREAST BIOPSY Right 2015   FOOT SURGERY Left 2019   NO PAST SURGERIES     Patient Active Problem List   Diagnosis Date Noted   ASCUS of cervix with negative high risk HPV 01/16/2023   Boil of vulva 11/14/2019   Encounter for gynecological examination with Papanicolaou smear of cervix 10/27/2019   Encounter for surveillance of contraceptive pills 10/27/2019   Hypertension 10/27/2019   Stress incontinence 09/24/2019   Lichen sclerosus 09/24/2019   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   OAB (overactive bladder) 09/24/2019   Elevated BP without diagnosis of hypertension 09/24/2019   Vulvar irritation 09/11/2019    PCP: Toribio Jerel MATSU, MD   REFERRING PROVIDER: Joya Stabs, DPM  REFERRING DIAG: M72.2 (ICD-10-CM) - Plantar fasciitis of right foot  THERAPY DIAG:  Pain in right foot  Pain in right lower leg  Impaired functional mobility, balance, gait, and endurance  Rationale for Evaluation and Treatment: Rehabilitation  ONSET DATE: 06/26/23  SUBJECTIVE:   SUBJECTIVE STATEMENT: Her foot felt really good for about 2-3 days but has tried to do more walking 30 min in the morning and 30 in  the evening and the pain has returned although not quite as severe.    Eval:  Pt states she has been dealing with Right heel pain for 10 years, recently when through a partial release of plantar fascia. Pt states it helped a little bit but. Pt states she can not stand or walk long on it with out it hurting or having to sit down. Pt tried therapy 2 times before the surgery. 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 Pt states she literally can barely make it back to the car after work, cries and hobbles into home.  PERTINENT HISTORY: -Left foot surgery 7 years ago, sesamoid removal -lost 65lbs in last year  PAIN:  Are you having pain? Yes: NPRS scale: 5/10 Pain location: arch of right foot,  Pain description: burning, sharp, shooting Aggravating factors: walking, standing Relieving factors: prednisone, ice  PRECAUTIONS: None  RED FLAGS: None   WEIGHT BEARING RESTRICTIONS: No  FALLS:  Has patient fallen in last 6 months? No  LIVING ENVIRONMENT: Lives with: lives with their family Lives in: House/apartment Stairs: Yes: External: 4 steps; on right going up Has following equipment at home: Crutches, on occassion  OCCUPATION: [employer], out on leave  PLOF: Independent with basic ADLs, Needs assistance with ADLs, and hired someone to help clean  PATIENT  GOALS: walk and stand without pain in right foot, get back to work  NEXT MD VISIT: June 30th  OBJECTIVE:  Note: Objective measures were completed at Evaluation unless otherwise noted.  DIAGNOSTIC FINDINGS: CLINICAL DATA:  Heel pain, chronic surgical planning for plantar fasciitis   EXAM: MR OF THE RIGHT HEEL WITHOUT CONTRAST   TECHNIQUE: Multiplanar, multisequence MR imaging of the right foot was performed. No intravenous contrast was administered.   COMPARISON:  Right foot radiographs dated January 16, 2023.   FINDINGS: Bones/Joint/Cartilage   No fracture or dislocation.  Normal alignment. No joint effusion. No marrow signal abnormality.   Ligaments   The components of the lateral collateral ligament, deltoid ligament, and syndesmotic ligaments appear grossly intact. Lisfranc ligament is intact.   Muscles and Tendons   There is a longitudinal split tear of the peroneal longus and brevis tendons extending from the retromalleolar level through the midfoot with tenosynovitis of the common peroneal tendon sheath. Mild tenosynovitis of the posterior tibialis tendon just below the level of the medial malleolus. Flexor and extensor compartment tendons are intact. Achilles tendon is intact. Muscles are normal.   Plantar fascia   Thickening of the central cord of the plantar fascia with surrounding edema. There is an inferior calcaneal enthesophyte with mild associated edema.   Soft tissue Mild subcutaneous edema at the plantar heel.  Muscles are normal.   IMPRESSION: 1. Acute on chronic plantar fasciitis. 2. Longitudinal split tears of the peroneal longus and brevis tendons with tenosynovitis of the common peroneal tendon sheath. 3. Mild tenosynovitis of the posterior tibialis tendon.  PATIENT SURVEYS:  LEFS 35/80 LEFS: 62/80  COGNITION: Overall cognitive status: Within functional limits for tasks assessed     SENSATION: WFL  EDEMA:  On occasion, not at this time  PALPATION: Pt demonstrates tenderness on medial and lateral arch, lateral compartment of lower leg and posterior tib region  LOWER EXTREMITY ROM:  Active ROM Right eval Left eval Right 10/09/23 Right 11/07/23  Hip flexion      Hip extension      Hip abduction      Hip adduction      Hip internal rotation      Hip external rotation      Knee flexion      Knee extension      Ankle dorsiflexion 9 9 11 8   Ankle plantarflexion 55 63 73 54  Ankle inversion 40 50 41 34  Ankle eversion 28, some pain lateral compartment 20 26, no pain reported 14* hurting laterally today    (Blank rows = not tested)  LOWER EXTREMITY MMT:  MMT Right eval Left eval Right 10/09/23 Right 11/07/23  Hip flexion      Hip extension      Hip abduction      Hip adduction      Hip internal rotation      Hip external rotation      Knee flexion      Knee extension      Ankle dorsiflexion 4- 5 4+ 4+  Ankle plantarflexion 4-, pain on inside of arch and lateral compartment of leg  4- 4 4-  Ankle inversion 4 5 4+ 4  Ankle eversion 4- 5 4+ 4   (Blank rows = not tested)  LOWER EXTREMITY SPECIAL TESTS:  Ankle special tests: Great toe extension test: negative  FUNCTIONAL TESTS:  2 minute walk test: 294 feet 10/09/23: 406 feet, 5/10 pain reported  SLS R: 5.60s L: 26s SLS  10/09/23: R: 30s L: 30s  GAIT: Distance walked: 294 Assistive device utilized: Pt reaching for handrail on wall several times for off weighting of RLE and None Level of assistance: Complete Independence Comments: Pt presents with increasingly antalgic gait pattern, decreased velocity, decreased stance time on RLE.                                                                                                                                TREATMENT DATE 11/15/23 Trigger Point Dry Needling  Subsequent Treatment: Instructions provided previously at initial dry needling treatment.   Patient Verbal Consent Given: Yes Education Handout Provided: Previously Provided Muscles Treated: right calf; quadratus plantae Electrical Stimulation Performed: No Treatment Response/Outcome: decreased tightness of muscle bands in calf   11/07/23 Progress note SLS 30 LEFS 57/80 71.3% AROM and MMT's see above Trigger Point Dry Needling  Initial Treatment: Pt instructed on Dry Needling rational, procedures, and possible side effects. Pt instructed to expect mild to moderate muscle soreness later in the day and/or into the next day.  Pt instructed in methods to reduce muscle soreness. Pt instructed to continue prescribed  HEP. Patient was educated on signs and symptoms of infection and other risk factors and advised to seek medical attention should they occur.  Patient verbalized understanding of these instructions and education.   Patient Verbal Consent Given: Yes Education Handout Provided: Yes Muscles Treated: right gastroc, plantar fascia Electrical Stimulation Performed: No Treatment Response/Outcome: soreness; twitch right calf    10/16/23: - Heel and toe raises 2x 10 - Sit to stand to heel raise 10x - SLS Rt 24 - 436ft, pain scale 5-6/10 following with increased pain per step - Manual therapy supine with LE elevated:   MFR to plantar fascia with increased focus on lateral calcaneus, STM to posterior tib -Slant board soleus and gastroc 3x 30  10/11/2023  Manual Therapy: -STM: to incision, medial arch, posterior tib and peroneals -Manual achilles and posterior tibialis stretch  Therapeutic Exercise: -Stationary bike, 5 minute warm up, no resistance -Step up to SLS on 8 inch steps, 2 sets of 10 reps -Lateral step up and overs, 8 inch step, 1 set of 5 reps bilaterally. -Heel/toe raises, 2 sets of 10 reps, seated as last time was too painful -Bosu ball knee drive stretch, 2 sets of 5 reps -Rocker board DF/PF, 2 sets of 10 reps -[place] fwd/bwd, 2 sets of 30 seconds each variation  10/09/2023  Progress Note: , SLS assessed, ROM and strength assessed of R ankle Manual Therapy: -STM: to incision, medial arch -Manual achilles and posterior tibialis stretch, 2 sets of 15 seconds holds  Therapeutic Exercise: -Heel/toe raises, 2 sets of 10 reps, seated as last time was too painful -10 Long sitting ankle circles following manual -Sled Pushes, 3 laps, 40 pounds, pt cued for constant sled movement, pt demonstrates increased symptoms with pushing direction    10/04/23: Standing with bil UE assist:   -  BAPs Board, level 3, 2 set of 10 reps, DF/PF, EV/INV, CW/CCW - Heel/Toe raises on  incline/decline 2x 10 - Vector stance with 1 UE support 2 sets 5x 5 - Slant board 3x 30 Manual supine scar massage to plantar fascia    PATIENT EDUCATION:  Education details: Pt was educated on findings of PT evaluation, prognosis, frequency of therapy visits and rationale, attendance policy, and HEP if given.   Person educated: Patient Education method: Explanation, Verbal cues, and Handouts Education comprehension: verbalized understanding, verbal cues required, and needs further education  HOME EXERCISE PROGRAM: Access Code: [email or web address] URL: [email or web address] Date: 09/04/2023 Prepared by: Lang Ada  Exercises - Gastroc Stretch on Wall  - 1 x daily - 7 x weekly - 3 sets - 30 hold - Single Leg Stance  - 1 x daily - 7 x weekly - 3 sets - 10 reps - Towel Scrunches  - 1 x daily - 7 x weekly - 3 sets - 10 reps - Arch Lifting  - 1 x daily - 7 x weekly - 3 sets - 10 reps   09/06/23: begin self manual scar tissue massage. Encouraged to walk with [facility]  09/25/23: - Seated Figure 4 Ankle Inversion with Resistance  - 2 x daily - 7 x weekly - 1 sets - 10 reps - 5 hold - Seated Ankle Eversion with Resistance  - 2 x daily - 7 x weekly - 1 sets - 10 reps - 5 hold  10/11/23: tennis ball given for self mobilization ASSESSMENT:  CLINICAL IMPRESSION: Patient returns  for dry needling. Noted tightness today in lateral calf; peroneals and continued tenderness proximal plantar fascia.  Continued with dry needling with noted decreased tautness of muscle tissue in right calf and peroneals.  Very tender with needling to plantar fascia.  Patient wearing inserts in her shoes.    Patient will benefit from continued skilled therapy services to address deficits and promote return to optimal function.      Eval:  Patient is a 43 y.o. female who was seen today for physical therapy evaluation and treatment for M72.2 (ICD-10-CM) - Plantar fasciitis of right foot. Patient demonstrates  antalgic gait, pain in right foot and lateral compartment of leg during AROM, decreased RLE strength, and impaired balance on RLE. Patient also demonstrates difficulty with ambulation during today's session with decreased stride length and velocity noted due increasing amount of pain during . Patient also demonstrates point tenderness to R foot arch, posterior tib insertion and origin, as well as along peroneal musculature.  Patient would benefit from skilled physical therapy for decreased pain of RLE, increased endurance with ambulation, increased LE strength, and balance for improved gait quality, return to higher level of function with ADLs, and progress towards therapy goals.   OBJECTIVE IMPAIRMENTS: Abnormal gait, decreased activity tolerance, decreased balance, decreased endurance, decreased mobility, difficulty walking, decreased strength, and pain.   ACTIVITY LIMITATIONS: carrying, lifting, bending, standing, squatting, stairs, transfers, and locomotion level  PARTICIPATION LIMITATIONS: meal prep, cleaning, laundry, driving, shopping, community activity, occupation, and yard work  PERSONAL FACTORS: Past/current experiences, Time since onset of injury/illness/exacerbation, and 1 comorbidity: surgery already completed are also affecting patient's functional outcome.   REHAB POTENTIAL: Fair 3 months since surgery  CLINICAL DECISION MAKING: Stable/uncomplicated  EVALUATION COMPLEXITY: Low   GOALS: Goals reviewed with patient? No  SHORT TERM GOALS: Target date: 09/25/23  Patient will demonstrate evidence of independence with individualized HEP and will report compliance for at least 3 days per week  for optimized progression towards remaining therapy goals. Baseline: 10/02/23:  Reports compliance with HEP daily. Goal status: MET  2.  Patient will report a decrease in pain level during community ambulation by at least 2 points for improved quality of life. Baseline: 5/10; 10/02/23:   Continues to be limited by pain, increased with standing/walking Goal status: IN PROGRESS     LONG TERM GOALS: Target date: 10/16/23  Pt will demonstrate a an increase of at least 9 points on the LEFS for improved performance of community ambulation and ADL. Baseline: see objective Goal status: MET  2.  Pt will improve 2 MWT by 140 feet in order to demonstrate improved functional ambulatory capacity in community setting.  Baseline: see objective; 10/16/23:  increased cadence by 123 ft with increased pain following gait. Goal status: IN PROGRESS  3.  Pt will demonstrate WFL pain free ROM (flexion and extension) in right ankle, for increased mobility and maximal efficiency of gait cycle during ambulation. Baseline: see objective Goal status: MET  4.  Pt will demonstrate at least 4/5 MMT for right ankle extremity for increased strength during ADL and community ambulation. Baseline: see objective Goal status: IN PROGRESS  5.  Pt will improve R SLS by 10 seconds in order to improve balance and stability of RLE in comparison to LLE during functional activities. Baseline: see objective Goal status: MET    PLAN:  PT FREQUENCY: 1x/week  PT DURATION: 4 weeks  PLANNED INTERVENTIONS: 97110-Therapeutic exercises, 97530- Therapeutic activity, [id number]- Neuromuscular re-education, 97535- Self Care, [id number]- Manual therapy, [phone]- Gait training, Patient/Family education, Balance training, Stair training, Joint mobilization, DME instructions, Cryotherapy, Moist heat, and Dry needling  PLAN FOR NEXT SESSION: Continue 2 more session with focus on dry needling for pain control.  9:20 AM, 11/15/23 Aniruddh Ciavarella Small Shantel Helwig MPT [facility] [place] [phone]

## 2023-11-20 ENCOUNTER — Ambulatory Visit (HOSPITAL_COMMUNITY)

## 2023-11-20 DIAGNOSIS — Z7409 Other reduced mobility: Secondary | ICD-10-CM

## 2023-11-20 DIAGNOSIS — M79671 Pain in right foot: Secondary | ICD-10-CM

## 2023-11-20 DIAGNOSIS — M79661 Pain in right lower leg: Secondary | ICD-10-CM

## 2023-11-20 NOTE — Therapy (Signed)
 OUTPATIENT PHYSICAL THERAPY LOWER EXTREMITY TREATMENT    Patient Name: Jeanette Morales MRN: [id number] DOB:18-Nov-1980, 43 y.o., female Today's Date: 11/20/2023  END OF SESSION:  PT End of Session - 11/20/23 0851     Visit Number 12    Number of Visits 12    Date for PT Re-Evaluation 10/16/23    Authorization Type [employer]    Authorization Time Period no auth required    Progress Note Due on Visit 10    PT Start Time [phone]    PT Stop Time 0930    PT Time Calculation (min) 39 min    Activity Tolerance Patient tolerated treatment well;Patient limited by pain    Behavior During Therapy [facility] for tasks assessed/performed               Past Medical History:  Diagnosis Date   Hypertension    Palpitations    Past Surgical History:  Procedure Laterality Date   BREAST BIOPSY Right 2015   FOOT SURGERY Left 2019   NO PAST SURGERIES     Patient Active Problem List   Diagnosis Date Noted   ASCUS of cervix with negative high risk HPV 01/16/2023   Boil of vulva 11/14/2019   Encounter for gynecological examination with Papanicolaou smear of cervix 10/27/2019   Encounter for surveillance of contraceptive pills 10/27/2019   Hypertension 10/27/2019   Stress incontinence 09/24/2019   Lichen sclerosus 09/24/2019   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  Wore off quickly but pain level is overall less.  Did go to ball game in  [place] over the weekend and did ok; was sore afterwards.    Eval:  Pt states she has been dealing with Right heel pain for 10 years, recently when through a partial release of plantar fascia. Pt states it helped a little bit but. Pt states she can not stand or walk long on it with out it hurting or having to sit down. Pt tried therapy 2 times before the surgery. 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 Pt states she literally can barely make it back to the car after work, cries and hobbles into home.  PERTINENT HISTORY: -Left foot surgery 7 years ago, sesamoid removal -lost 65lbs in last year  PAIN:  Are you having pain? Yes: NPRS scale: 5/10 Pain location: arch of right foot,  Pain description: burning, sharp, shooting Aggravating factors: walking, standing Relieving factors: prednisone, ice  PRECAUTIONS: None  RED FLAGS: None   WEIGHT BEARING RESTRICTIONS: No  FALLS:  Has patient fallen in last 6 months? No  LIVING ENVIRONMENT: Lives with: lives with their family Lives in: House/apartment Stairs: Yes: External: 4 steps; on right going up Has following equipment at home: Crutches, on occassion  OCCUPATION: [employer], out on leave  PLOF: Independent with basic ADLs, Needs assistance with ADLs, and hired someone to help clean  PATIENT GOALS: walk  and stand without pain in right foot, get back to work  NEXT MD VISIT: June 30th  OBJECTIVE:  Note: Objective measures were completed at Evaluation unless otherwise noted.  DIAGNOSTIC FINDINGS: CLINICAL DATA:  Heel pain, chronic surgical planning for plantar fasciitis   EXAM: MR OF THE RIGHT HEEL WITHOUT CONTRAST   TECHNIQUE: Multiplanar, multisequence MR imaging of the right foot was performed. No intravenous contrast was administered.   COMPARISON:  Right foot radiographs dated January 16, 2023.   FINDINGS: Bones/Joint/Cartilage   No fracture or dislocation. Normal  alignment. No joint effusion. No marrow signal abnormality.   Ligaments   The components of the lateral collateral ligament, deltoid ligament, and syndesmotic ligaments appear grossly intact. Lisfranc ligament is intact.   Muscles and Tendons   There is a longitudinal split tear of the peroneal longus and brevis tendons extending from the retromalleolar level through the midfoot with tenosynovitis of the common peroneal tendon sheath. Mild tenosynovitis of the posterior tibialis tendon just below the level of the medial malleolus. Flexor and extensor compartment tendons are intact. Achilles tendon is intact. Muscles are normal.   Plantar fascia   Thickening of the central cord of the plantar fascia with surrounding edema. There is an inferior calcaneal enthesophyte with mild associated edema.   Soft tissue Mild subcutaneous edema at the plantar heel.  Muscles are normal.   IMPRESSION: 1. Acute on chronic plantar fasciitis. 2. Longitudinal split tears of the peroneal longus and brevis tendons with tenosynovitis of the common peroneal tendon sheath. 3. Mild tenosynovitis of the posterior tibialis tendon.  PATIENT SURVEYS:  LEFS 35/80 LEFS: 62/80  COGNITION: Overall cognitive status: Within functional limits for tasks assessed     SENSATION: WFL  EDEMA:  On occasion, not at this time  PALPATION: Pt demonstrates tenderness on medial and lateral arch, lateral compartment of lower leg and posterior tib region  LOWER EXTREMITY ROM:  Active ROM Right eval Left eval Right 10/09/23 Right 11/07/23  Hip flexion      Hip extension      Hip abduction      Hip adduction      Hip internal rotation      Hip external rotation      Knee flexion      Knee extension      Ankle dorsiflexion 9 9 11 8   Ankle plantarflexion 55 63 73 54  Ankle inversion 40 50 41 34  Ankle eversion 28, some pain lateral compartment 20 26, no pain reported 14* hurting laterally today   (Blank  rows = not tested)  LOWER EXTREMITY MMT:  MMT Right eval Left eval Right 10/09/23 Right 11/07/23  Hip flexion      Hip extension      Hip abduction      Hip adduction      Hip internal rotation      Hip external rotation      Knee flexion      Knee extension      Ankle dorsiflexion 4- 5 4+ 4+  Ankle plantarflexion 4-, pain on inside of arch and lateral compartment of leg  4- 4 4-  Ankle inversion 4 5 4+ 4  Ankle eversion 4- 5 4+ 4   (Blank rows = not tested)  LOWER EXTREMITY SPECIAL TESTS:  Ankle special tests: Great toe extension test: negative  FUNCTIONAL TESTS:  2 minute walk test: 294 feet 10/09/23: 406 feet, 5/10 pain reported  SLS R: 5.60s L: 26s SLS 10/09/23: R:  30s L: 30s  GAIT: Distance walked: 294 Assistive device utilized: Pt reaching for handrail on wall several times for off weighting of RLE and None Level of assistance: Complete Independence Comments: Pt presents with increasingly antalgic gait pattern, decreased velocity, decreased stance time on RLE.                                                                                                                                TREATMENT DATE 11/20/23 Trigger Point Dry Needling  Subsequent Treatment: Instructions provided previously at initial dry needling treatment.   Patient Verbal Consent Given: Yes Education Handout Provided: Previously Provided Muscles Treated: right calf, right quadratus plantae Electrical Stimulation Performed: No Treatment Response/Outcome: bruising right calf; decreased tenderness plantar fascia   11/15/23 Trigger Point Dry Needling  Subsequent Treatment: Instructions provided previously at initial dry needling treatment.   Patient Verbal Consent Given: Yes Education Handout Provided: Previously Provided Muscles Treated: right calf; quadratus plantae Electrical Stimulation Performed: No Treatment Response/Outcome: decreased tightness of muscle bands in  calf   11/07/23 Progress note SLS 30 LEFS 57/80 71.3% AROM and MMT's see above Trigger Point Dry Needling  Initial Treatment: Pt instructed on Dry Needling rational, procedures, and possible side effects. Pt instructed to expect mild to moderate muscle soreness later in the day and/or into the next day.  Pt instructed in methods to reduce muscle soreness. Pt instructed to continue prescribed HEP. Patient was educated on signs and symptoms of infection and other risk factors and advised to seek medical attention should they occur.  Patient verbalized understanding of these instructions and education.   Patient Verbal Consent Given: Yes Education Handout Provided: Yes Muscles Treated: right gastroc, plantar fascia Electrical Stimulation Performed: No Treatment Response/Outcome: soreness; twitch right calf    10/16/23: - Heel and toe raises 2x 10 - Sit to stand to heel raise 10x - SLS Rt 24 - 446ft, pain scale 5-6/10 following with increased pain per step - Manual therapy supine with LE elevated:   MFR to plantar fascia with increased focus on lateral calcaneus, STM to posterior tib -Slant board soleus and gastroc 3x 30  10/11/2023  Manual Therapy: -STM: to incision, medial arch, posterior tib and peroneals -Manual achilles and posterior tibialis stretch  Therapeutic Exercise: -Stationary bike, 5 minute warm up, no resistance -Step up to SLS on 8 inch steps, 2 sets of 10 reps -Lateral step up and overs, 8 inch step, 1 set of 5 reps bilaterally. -Heel/toe raises, 2 sets of 10 reps, seated as last time was too painful -Bosu ball knee drive stretch, 2 sets of 5 reps -Rocker board DF/PF, 2 sets of 10 reps -[place] fwd/bwd, 2 sets of 30 seconds each variation  10/09/2023  Progress Note: , SLS assessed, ROM and strength assessed of R ankle Manual Therapy: -STM: to incision, medial arch -Manual achilles and posterior tibialis stretch, 2 sets of 15 seconds  holds  Therapeutic Exercise: -Heel/toe  raises, 2 sets of 10 reps, seated as last time was too painful -10 Long sitting ankle circles following manual -Sled Pushes, 3 laps, 40 pounds, pt cued for constant sled movement, pt demonstrates increased symptoms with pushing direction    10/04/23: Standing with bil UE assist:   - BAPs Board, level 3, 2 set of 10 reps, DF/PF, EV/INV, CW/CCW - Heel/Toe raises on incline/decline 2x 10 - Vector stance with 1 UE support 2 sets 5x 5 - Slant board 3x 30 Manual supine scar massage to plantar fascia    PATIENT EDUCATION:  Education details: Pt was educated on findings of PT evaluation, prognosis, frequency of therapy visits and rationale, attendance policy, and HEP if given.   Person educated: Patient Education method: Explanation, Verbal cues, and Handouts Education comprehension: verbalized understanding, verbal cues required, and needs further education  HOME EXERCISE PROGRAM: Access Code: [email or web address] URL: [email or web address] Date: 09/04/2023 Prepared by: Lang Ada  Exercises - Gastroc Stretch on Wall  - 1 x daily - 7 x weekly - 3 sets - 30 hold - Single Leg Stance  - 1 x daily - 7 x weekly - 3 sets - 10 reps - Towel Scrunches  - 1 x daily - 7 x weekly - 3 sets - 10 reps - Arch Lifting  - 1 x daily - 7 x weekly - 3 sets - 10 reps   09/06/23: begin self manual scar tissue massage. Encouraged to walk with [facility]  09/25/23: - Seated Figure 4 Ankle Inversion with Resistance  - 2 x daily - 7 x weekly - 1 sets - 10 reps - 5 hold - Seated Ankle Eversion with Resistance  - 2 x daily - 7 x weekly - 1 sets - 10 reps - 5 hold  10/11/23: tennis ball given for self mobilization ASSESSMENT:  CLINICAL IMPRESSION: Patient returns  for dry needling. Noted tightness today in lateral calf; peroneals and continued tenderness proximal plantar fascia.  Continued with dry needling with noted decreased tautness of muscle tissue in right calf and  peroneals. Today patient with noted bruising to right calf after needling.   Decreased tenderness on palpation to  plantar fascia noted.    Patient will benefit from continued skilled therapy services to address deficits and promote return to optimal function.      Eval:  Patient is a 43 y.o. female who was seen today for physical therapy evaluation and treatment for M72.2 (ICD-10-CM) - Plantar fasciitis of right foot. Patient demonstrates antalgic gait, pain in right foot and lateral compartment of leg during AROM, decreased RLE strength, and impaired balance on RLE. Patient also demonstrates difficulty with ambulation during today's session with decreased stride length and velocity noted due increasing amount of pain during . Patient also demonstrates point tenderness to R foot arch, posterior tib insertion and origin, as well as along peroneal musculature.  Patient would benefit from skilled physical therapy for decreased pain of RLE, increased endurance with ambulation, increased LE strength, and balance for improved gait quality, return to higher level of function with ADLs, and progress towards therapy goals.   OBJECTIVE IMPAIRMENTS: Abnormal gait, decreased activity tolerance, decreased balance, decreased endurance, decreased mobility, difficulty walking, decreased strength, and pain.   ACTIVITY LIMITATIONS: carrying, lifting, bending, standing, squatting, stairs, transfers, and locomotion level  PARTICIPATION LIMITATIONS: meal prep, cleaning, laundry, driving, shopping, community activity, occupation, and yard work  PERSONAL FACTORS: Past/current experiences, Time since onset of injury/illness/exacerbation, and 1 comorbidity: surgery already completed are also affecting patient's  functional outcome.   REHAB POTENTIAL: Fair 3 months since surgery  CLINICAL DECISION MAKING: Stable/uncomplicated  EVALUATION COMPLEXITY: Low   GOALS: Goals reviewed with patient? No  SHORT TERM GOALS:  Target date: 09/25/23  Patient will demonstrate evidence of independence with individualized HEP and will report compliance for at least 3 days per week for optimized progression towards remaining therapy goals. Baseline: 10/02/23:  Reports compliance with HEP daily. Goal status: MET  2.  Patient will report a decrease in pain level during community ambulation by at least 2 points for improved quality of life. Baseline: 5/10; 10/02/23:  Continues to be limited by pain, increased with standing/walking Goal status: IN PROGRESS     LONG TERM GOALS: Target date: 10/16/23  Pt will demonstrate a an increase of at least 9 points on the LEFS for improved performance of community ambulation and ADL. Baseline: see objective Goal status: MET  2.  Pt will improve 2 MWT by 140 feet in order to demonstrate improved functional ambulatory capacity in community setting.  Baseline: see objective; 10/16/23:  increased cadence by 123 ft with increased pain following gait. Goal status: IN PROGRESS  3.  Pt will demonstrate WFL pain free ROM (flexion and extension) in right ankle, for increased mobility and maximal efficiency of gait cycle during ambulation. Baseline: see objective Goal status: MET  4.  Pt will demonstrate at least 4/5 MMT for right ankle extremity for increased strength during ADL and community ambulation. Baseline: see objective Goal status: IN PROGRESS  5.  Pt will improve R SLS by 10 seconds in order to improve balance and stability of RLE in comparison to LLE during functional activities. Baseline: see objective Goal status: MET    PLAN:  PT FREQUENCY: 1x/week  PT DURATION: 4 weeks  PLANNED INTERVENTIONS: 97110-Therapeutic exercises, 97530- Therapeutic activity, [id number]- Neuromuscular re-education, 97535- Self Care, [id number]- Manual therapy, [phone]- Gait training, Patient/Family education, Balance training, Stair training, Joint mobilization, DME instructions, Cryotherapy, Moist heat,  and Dry needling  PLAN FOR NEXT SESSION: Continue 1 more session with focus on dry needling for pain control. Reassess next visit  9:30 AM, 11/20/23 Khamila Bassinger Small Grenda Lora MPT [facility] [place] [phone]

## 2023-11-27 ENCOUNTER — Ambulatory Visit (HOSPITAL_COMMUNITY)

## 2023-11-27 DIAGNOSIS — M79671 Pain in right foot: Secondary | ICD-10-CM | POA: Diagnosis not present

## 2023-11-27 DIAGNOSIS — M79661 Pain in right lower leg: Secondary | ICD-10-CM

## 2023-11-27 DIAGNOSIS — Z7409 Other reduced mobility: Secondary | ICD-10-CM

## 2023-11-27 NOTE — Therapy (Signed)
 OUTPATIENT PHYSICAL THERAPY LOWER EXTREMITY TREATMENT/ discharge PHYSICAL THERAPY DISCHARGE SUMMARY  Visits from Start of Care: 13  Current functional level related to goals / functional outcomes: See below   Remaining deficits: See below   Education / Equipment: HEP   Patient agrees to discharge. Patient goals were partially met. Patient is being discharged due to being pleased with the current functional level.     Patient Name: Jeanette Morales MRN: [id number] DOB:November 09, 1980, 43 y.o., female Today's Date: 11/27/2023  END OF SESSION:  PT End of Session - 11/27/23 0840     Visit Number 13    Number of Visits 12    Date for PT Re-Evaluation 11/15/23    Authorization Type [employer]UE [employer]    Authorization Time Period no auth required    Progress Note Due on Visit 10    PT Start Time 0843    PT Stop Time 0921    PT Time Calculation (min) 38 min    Activity Tolerance Patient tolerated treatment well;Patient limited by pain    Behavior During Therapy [facility] for tasks assessed/performed               Past Medical History:  Diagnosis Date   Hypertension    Palpitations    Past Surgical History:  Procedure Laterality Date   BREAST BIOPSY Right 2015   FOOT SURGERY Left 2019   NO PAST SURGERIES     Patient Active Problem List   Diagnosis Date Noted   ASCUS of cervix with negative high risk HPV 01/16/2023   Boil of vulva 11/14/2019   Encounter for gynecological examination with Papanicolaou smear of cervix 10/27/2019   Encounter for surveillance of contraceptive pills 10/27/2019   Hypertension 10/27/2019   Stress incontinence 09/24/2019   Lichen sclerosus 09/24/2019   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   OAB (overactive bladder) 09/24/2019   Elevated BP without diagnosis of hypertension 09/24/2019   Vulvar irritation 09/11/2019    PCP: Toribio Jerel MATSU, MD   REFERRING PROVIDER: Joya Stabs, DPM  REFERRING DIAG: M72.2 (ICD-10-CM) - Plantar  fasciitis of right foot  THERAPY DIAG:  Pain in right foot - Plan: PT plan of care cert/re-cert  Pain in right lower leg - Plan: PT plan of care cert/re-cert  Impaired functional mobility, balance, gait, and endurance - Plan: PT plan of care cert/re-cert  Rationale for Evaluation and Treatment: Rehabilitation  ONSET DATE: 06/26/23  SUBJECTIVE:   SUBJECTIVE STATEMENT: Felt good when she left last time.  Wore off quickly but pain level is overall less.  Did go to ball game in [place] over the weekend and did ok; was sore afterwards.    Eval:  Pt states she has been dealing with Right heel pain for 10 years, recently when through a partial release of plantar fascia. Pt states it helped a little bit but. Pt states she can not stand or walk long on it with out it hurting or having to sit down. Pt tried therapy 2 times before the surgery. 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 Pt states she literally can barely make it back to the car after work, cries and hobbles into home.  PERTINENT HISTORY: -Left foot surgery 7 years ago, sesamoid removal -lost 65lbs in last year  PAIN:  Are you having pain? Yes: NPRS scale: 5/10 Pain location: arch of right foot,  Pain description: burning, sharp, shooting Aggravating factors: walking, standing Relieving  factors: prednisone, ice  PRECAUTIONS: None  RED FLAGS: None   WEIGHT BEARING RESTRICTIONS: No  FALLS:  Has patient fallen in last 6 months? No  LIVING ENVIRONMENT: Lives with: lives with their family Lives in: House/apartment Stairs: Yes: External: 4 steps; on right going up Has following equipment at home: Crutches, on occassion  OCCUPATION: [employer], out on leave  PLOF: Independent with basic ADLs, Needs assistance with ADLs, and hired someone to help clean  PATIENT GOALS: walk and stand without pain in right foot, get back to work  NEXT MD VISIT: June 30th  OBJECTIVE:  Note:  Objective measures were completed at Evaluation unless otherwise noted.  DIAGNOSTIC FINDINGS: CLINICAL DATA:  Heel pain, chronic surgical planning for plantar fasciitis   EXAM: MR OF THE RIGHT HEEL WITHOUT CONTRAST   TECHNIQUE: Multiplanar, multisequence MR imaging of the right foot was performed. No intravenous contrast was administered.   COMPARISON:  Right foot radiographs dated January 16, 2023.   FINDINGS: Bones/Joint/Cartilage   No fracture or dislocation. Normal alignment. No joint effusion. No marrow signal abnormality.   Ligaments   The components of the lateral collateral ligament, deltoid ligament, and syndesmotic ligaments appear grossly intact. Lisfranc ligament is intact.   Muscles and Tendons   There is a longitudinal split tear of the peroneal longus and brevis tendons extending from the retromalleolar level through the midfoot with tenosynovitis of the common peroneal tendon sheath. Mild tenosynovitis of the posterior tibialis tendon just below the level of the medial malleolus. Flexor and extensor compartment tendons are intact. Achilles tendon is intact. Muscles are normal.   Plantar fascia   Thickening of the central cord of the plantar fascia with surrounding edema. There is an inferior calcaneal enthesophyte with mild associated edema.   Soft tissue Mild subcutaneous edema at the plantar heel.  Muscles are normal.   IMPRESSION: 1. Acute on chronic plantar fasciitis. 2. Longitudinal split tears of the peroneal longus and brevis tendons with tenosynovitis of the common peroneal tendon sheath. 3. Mild tenosynovitis of the posterior tibialis tendon.  PATIENT SURVEYS:  LEFS 35/80 LEFS: 62/80  COGNITION: Overall cognitive status: Within functional limits for tasks assessed     SENSATION: WFL  EDEMA:  On occasion, not at this time  PALPATION: Pt demonstrates tenderness on medial and lateral arch, lateral compartment of lower leg and  posterior tib region  LOWER EXTREMITY ROM:  Active ROM Right eval Left eval Right 10/09/23 Right 11/07/23 Right 11/27/23  Hip flexion       Hip extension       Hip abduction       Hip adduction       Hip internal rotation       Hip external rotation       Knee flexion       Knee extension       Ankle dorsiflexion 9 9 11 8 22   Ankle plantarflexion 55 63 73 54 55  Ankle inversion 40 50 41 34 40  Ankle eversion 28, some pain lateral compartment 20 26, no pain reported 14* hurting laterally today 29   (Blank rows = not tested)  LOWER EXTREMITY MMT:  MMT Right eval Left eval Right 10/09/23 Right 11/07/23 Right 11/27/23  Hip flexion       Hip extension       Hip abduction       Hip adduction       Hip internal rotation       Hip external rotation  Knee flexion       Knee extension       Ankle dorsiflexion 4- 5 4+ 4+ 4+  Ankle plantarflexion 4-, pain on inside of arch and lateral compartment of leg  4- 4 4-   Ankle inversion 4 5 4+ 4 4+  Ankle eversion 4- 5 4+ 4 4+   (Blank rows = not tested)  LOWER EXTREMITY SPECIAL TESTS:  Ankle special tests: Great toe extension test: negative  FUNCTIONAL TESTS:  2 minute walk test: 294 feet 10/09/23: 406 feet, 5/10 pain reported  SLS R: 5.60s L: 26s SLS 10/09/23: R: 30s L: 30s  GAIT: Distance walked: 294 Assistive device utilized: Pt reaching for handrail on wall several times for off weighting of RLE and None Level of assistance: Complete Independence Comments: Pt presents with increasingly antalgic gait pattern, decreased velocity, decreased stance time on RLE.                                                                                                                                TREATMENT DATE 11/27/23 Progress note AROM see above LEFS 68/80 Trigger Point Dry Needling  Subsequent Treatment: Instructions provided previously at initial dry needling treatment.   Patient Verbal Consent Given: Yes Education Handout  Provided: Previously Provided Muscles Treated: right calf; right quadratus plateau Electrical Stimulation Performed: No Treatment Response/Outcome: twitch, improved overall mobility, strength and functional survey score   11/20/23 Trigger Point Dry Needling  Subsequent Treatment: Instructions provided previously at initial dry needling treatment.   Patient Verbal Consent Given: Yes Education Handout Provided: Previously Provided Muscles Treated: right calf, right quadratus plantae Electrical Stimulation Performed: No Treatment Response/Outcome: bruising right calf; decreased tenderness plantar fascia   11/15/23 Trigger Point Dry Needling  Subsequent Treatment: Instructions provided previously at initial dry needling treatment.   Patient Verbal Consent Given: Yes Education Handout Provided: Previously Provided Muscles Treated: right calf; quadratus plantae Electrical Stimulation Performed: No Treatment Response/Outcome: decreased tightness of muscle bands in calf   11/07/23 Progress note SLS 30 LEFS 57/80 71.3% AROM and MMT's see above Trigger Point Dry Needling  Initial Treatment: Pt instructed on Dry Needling rational, procedures, and possible side effects. Pt instructed to expect mild to moderate muscle soreness later in the day and/or into the next day.  Pt instructed in methods to reduce muscle soreness. Pt instructed to continue prescribed HEP. Patient was educated on signs and symptoms of infection and other risk factors and advised to seek medical attention should they occur.  Patient verbalized understanding of these instructions and education.   Patient Verbal Consent Given: Yes Education Handout Provided: Yes Muscles Treated: right gastroc, plantar fascia Electrical Stimulation Performed: No Treatment Response/Outcome: soreness; twitch right calf    10/16/23: - Heel and toe raises 2x 10 - Sit to stand to heel raise 10x - SLS Rt 24 - 446ft, pain  scale 5-6/10 following with increased pain per step - Manual therapy supine  with LE elevated:   MFR to plantar fascia with increased focus on lateral calcaneus, STM to posterior tib -Slant board soleus and gastroc 3x 30  10/11/2023  Manual Therapy: -STM: to incision, medial arch, posterior tib and peroneals -Manual achilles and posterior tibialis stretch  Therapeutic Exercise: -Stationary bike, 5 minute warm up, no resistance -Step up to SLS on 8 inch steps, 2 sets of 10 reps -Lateral step up and overs, 8 inch step, 1 set of 5 reps bilaterally. -Heel/toe raises, 2 sets of 10 reps, seated as last time was too painful -Bosu ball knee drive stretch, 2 sets of 5 reps -Rocker board DF/PF, 2 sets of 10 reps -[place]mills fwd/bwd, 2 sets of 30 seconds each variation  10/09/2023  Progress Note: , SLS assessed, ROM and strength assessed of R ankle Manual Therapy: -STM: to incision, medial arch -Manual achilles and posterior tibialis stretch, 2 sets of 15 seconds holds  Therapeutic Exercise: -Heel/toe raises, 2 sets of 10 reps, seated as last time was too painful -10 Long sitting ankle circles following manual -Sled Pushes, 3 laps, 40 pounds, pt cued for constant sled movement, pt demonstrates increased symptoms with pushing direction    10/04/23: Standing with bil UE assist:   - BAPs Board, level 3, 2 set of 10 reps, DF/PF, EV/INV, CW/CCW - Heel/Toe raises on incline/decline 2x 10 - Vector stance with 1 UE support 2 sets 5x 5 - Slant board 3x 30 Manual supine scar massage to plantar fascia    PATIENT EDUCATION:  Education details: Pt was educated on findings of PT evaluation, prognosis, frequency of therapy visits and rationale, attendance policy, and HEP if given.   Person educated: Patient Education method: Explanation, Verbal cues, and Handouts Education comprehension: verbalized understanding, verbal cues required, and needs further education  HOME EXERCISE  PROGRAM: Access Code: [email or web address] URL: [email or web address] Date: 09/04/2023 Prepared by: Lang Ada  Exercises - Gastroc Stretch on Wall  - 1 x daily - 7 x weekly - 3 sets - 30 hold - Single Leg Stance  - 1 x daily - 7 x weekly - 3 sets - 10 reps - Towel Scrunches  - 1 x daily - 7 x weekly - 3 sets - 10 reps - Arch Lifting  - 1 x daily - 7 x weekly - 3 sets - 10 reps   09/06/23: begin self manual scar tissue massage. Encouraged to walk with [facility]  09/25/23: - Seated Figure 4 Ankle Inversion with Resistance  - 2 x daily - 7 x weekly - 1 sets - 10 reps - 5 hold - Seated Ankle Eversion with Resistance  - 2 x daily - 7 x weekly - 1 sets - 10 reps - 5 hold  10/11/23: tennis ball given for self mobilization ASSESSMENT:  CLINICAL IMPRESSION: Progress note today.  Patient with improved LEFS score and improved overall mobility and strength; decreased pain levels. Continued with dry needling with twitch right quadratus plantea today.  Discussed with patient continuing care with a therapist who does dry needling only for maintenance and she is agreeable; discharge formal PT today   Eval:  Patient is a 43 y.o. female who was seen today for physical therapy evaluation and treatment for M72.2 (ICD-10-CM) - Plantar fasciitis of right foot. Patient demonstrates antalgic gait, pain in right foot and lateral compartment of leg during AROM, decreased RLE strength, and impaired balance on RLE. Patient also demonstrates difficulty with ambulation during today's session with decreased stride length and velocity noted due  increasing amount of pain during . Patient also demonstrates point tenderness to R foot arch, posterior tib insertion and origin, as well as along peroneal musculature.  Patient would benefit from skilled physical therapy for decreased pain of RLE, increased endurance with ambulation, increased LE strength, and balance for improved gait quality, return to higher level of function  with ADLs, and progress towards therapy goals.   OBJECTIVE IMPAIRMENTS: Abnormal gait, decreased activity tolerance, decreased balance, decreased endurance, decreased mobility, difficulty walking, decreased strength, and pain.   ACTIVITY LIMITATIONS: carrying, lifting, bending, standing, squatting, stairs, transfers, and locomotion level  PARTICIPATION LIMITATIONS: meal prep, cleaning, laundry, driving, shopping, community activity, occupation, and yard work  PERSONAL FACTORS: Past/current experiences, Time since onset of injury/illness/exacerbation, and 1 comorbidity: surgery already completed are also affecting patient's functional outcome.   REHAB POTENTIAL: Fair 3 months since surgery  CLINICAL DECISION MAKING: Stable/uncomplicated  EVALUATION COMPLEXITY: Low   GOALS: Goals reviewed with patient? No  SHORT TERM GOALS: Target date: 09/25/23  Patient will demonstrate evidence of independence with individualized HEP and will report compliance for at least 3 days per week for optimized progression towards remaining therapy goals. Baseline: 10/02/23:  Reports compliance with HEP daily. Goal status: MET  2.  Patient will report a decrease in pain level during community ambulation by at least 2 points for improved quality of life. Baseline: 5/10; 10/02/23:  Continues to be limited by pain, increased with standing/walking Goal status: MET     LONG TERM GOALS: Target date: 10/16/23  Pt will demonstrate a an increase of at least 9 points on the LEFS for improved performance of community ambulation and ADL. Baseline: see objective Goal status: MET  2.  Pt will improve 2 MWT by 140 feet in order to demonstrate improved functional ambulatory capacity in community setting.  Baseline: see objective; 10/16/23:  increased cadence by 123 ft with increased pain following gait. Goal status: IN PROGRESS  3.  Pt will demonstrate WFL pain free ROM (flexion and extension) in right ankle, for  increased mobility and maximal efficiency of gait cycle during ambulation. Baseline: see objective Goal status: MET  4.  Pt will demonstrate at least 4/5 MMT for right ankle extremity for increased strength during ADL and community ambulation. Baseline: see objective Goal status: MET  5.  Pt will improve R SLS by 10 seconds in order to improve balance and stability of RLE in comparison to LLE during functional activities. Baseline: see objective Goal status: MET    PLAN:  PT FREQUENCY: 1x/week  PT DURATION: 4 weeks  PLANNED INTERVENTIONS: 97110-Therapeutic exercises, 97530- Therapeutic activity, 97112- Neuromuscular re-education, 97535- Self Care, 02859- Manual therapy, [phone]- Gait training, Patient/Family education, Balance training, Stair training, Joint mobilization, DME instructions, Cryotherapy, Moist heat, and Dry needling  PLAN FOR NEXT SESSION: discharge  9:22 AM, 11/27/23 Birch Farino Small Anvita Hirata MPT [place] [facility] [place] [phone] Ph:[phone]

## 2023-12-17 ENCOUNTER — Telehealth: Payer: Self-pay | Admitting: Podiatry

## 2023-12-17 ENCOUNTER — Encounter: Payer: Self-pay | Admitting: Podiatry

## 2023-12-17 ENCOUNTER — Ambulatory Visit (INDEPENDENT_AMBULATORY_CARE_PROVIDER_SITE_OTHER): Admitting: Podiatry

## 2023-12-17 DIAGNOSIS — M722 Plantar fascial fibromatosis: Secondary | ICD-10-CM

## 2023-12-17 DIAGNOSIS — Z0271 Encounter for disability determination: Secondary | ICD-10-CM

## 2023-12-17 NOTE — Progress Notes (Signed)
  Subjective:  Patient ID: Jeanette Morales, female    DOB: 04-29-1981,  MRN: [id number]  Chief Complaint  Patient presents with   Routine Post Op    DOS: 06/26/23   Right plantar fasciotomy  It ain't no better.    DOS: 06/26/23  Procedure: Right plantar fasciotomy   43 y.o. female returns for POV#6. 6 months out from surgery.  Relates not any better. Dry needling helped for a day but then pain comes back.   Review of Systems: Negative except as noted in the HPI. Denies N/V/F/Ch.  Past Medical History:  Diagnosis Date   Hypertension    Palpitations     Current Outpatient Medications:    DULoxetine (CYMBALTA) 30 MG capsule, Take 30 mg by mouth daily., Disp: , Rfl:    JUNEL  1/20 1-20 MG-MCG tablet, Take 1 tablet by mouth daily., Disp: 84 tablet, Rfl: 4   losartan -hydrochlorothiazide (HYZAAR) 50-12.5 MG tablet, TAKE 1 TABLET BY MOUTH EVERY DAY, Disp: 90 tablet, Rfl: 3   ondansetron  (ZOFRAN ) 4 MG tablet, Take 1 tablet (4 mg total) by mouth every 8 (eight) hours as needed for nausea or vomiting., Disp: 20 tablet, Rfl: 0   UNABLE TO FIND, Ozempic compound-weekly, Disp: , Rfl:    meloxicam  (MOBIC ) 15 MG tablet, Take 1 tablet (15 mg total) by mouth daily. (Patient not taking: Reported on 12/17/2023), Disp: 30 tablet, Rfl: 0   methylPREDNISolone  (MEDROL  DOSEPAK) 4 MG TBPK tablet, Take as directed (Patient not taking: Reported on 12/17/2023), Disp: 21 tablet, Rfl: 0  Social History   Tobacco Use  Smoking Status Never  Smokeless Tobacco Never    Allergies  Allergen Reactions   Lexapro [Escitalopram Oxalate]     agitation   Objective:  There were no vitals filed for this visit. There is no height or weight on file to calculate BMI. Constitutional Well developed. Well nourished.  Vascular Foot warm and well perfused. Capillary refill normal to all digits.   Neurologic Normal speech. Oriented to person, place, and time. Epicritic sensation to light touch grossly present bilaterally.   Dermatologic Skin healing well without signs of infection. Skin edges well coapted without signs of infection. Tender to medial calcaneal tubercle on the right.   Orthopedic: Tenderness to palpation noted about the surgical site.    Assessment:   1. Plantar fasciitis of right foot       Plan:  Patient was evaluated and treated and all questions answered.  S/p foot surgery right -Progressing as expected post-operatively. -WB Status: WBAT in regular shoe -Continue PT.  -Continue orthotics  -Discussed surgical intervention including gastrocnemius resection vs PRP injection.  Patient opts to try PRP injection with ultrasound will get shceduled.  -Medications: n/a   Return for PRP injection with ultrasound. .    No follow-ups on file.

## 2023-12-17 NOTE — Telephone Encounter (Signed)
 Recd [employer] forms. Faxed form/note/letter to [phone] Revised RTW 12/24/23

## 2023-12-24 ENCOUNTER — Ambulatory Visit

## 2024-01-30 ENCOUNTER — Other Ambulatory Visit: Payer: Self-pay | Admitting: Family Medicine

## 2024-01-30 DIAGNOSIS — Z1231 Encounter for screening mammogram for malignant neoplasm of breast: Secondary | ICD-10-CM

## 2024-02-14 ENCOUNTER — Ambulatory Visit
Admission: RE | Admit: 2024-02-14 | Discharge: 2024-02-14 | Disposition: A | Discharge status: Home / Self Care | Source: Ambulatory Visit | Attending: Family Medicine | Admitting: Family Medicine

## 2024-02-14 DIAGNOSIS — Z1231 Encounter for screening mammogram for malignant neoplasm of breast: Secondary | ICD-10-CM

## 2024-02-18 ENCOUNTER — Ambulatory Visit (INDEPENDENT_AMBULATORY_CARE_PROVIDER_SITE_OTHER): Admitting: Podiatry

## 2024-02-18 DIAGNOSIS — Z91199 Patient's noncompliance with other medical treatment and regimen due to unspecified reason: Secondary | ICD-10-CM

## 2024-02-18 NOTE — Progress Notes (Signed)
 Cancel 24 hours-daughter in labor

## 2024-04-05 ENCOUNTER — Other Ambulatory Visit: Payer: Self-pay | Admitting: Adult Health

## 2024-04-16 ENCOUNTER — Encounter: Payer: Self-pay | Admitting: Adult Health

## 2024-04-16 ENCOUNTER — Ambulatory Visit: Admitting: Adult Health

## 2024-04-16 VITALS — BP 150/84 | HR 77 | Ht 64.0 in | Wt 246.0 lb

## 2024-04-16 DIAGNOSIS — E785 Hyperlipidemia, unspecified: Secondary | ICD-10-CM | POA: Diagnosis not present

## 2024-04-16 DIAGNOSIS — Z1331 Encounter for screening for depression: Secondary | ICD-10-CM

## 2024-04-16 DIAGNOSIS — Z01419 Encounter for gynecological examination (general) (routine) without abnormal findings: Secondary | ICD-10-CM | POA: Diagnosis not present

## 2024-04-16 DIAGNOSIS — E7800 Pure hypercholesterolemia, unspecified: Secondary | ICD-10-CM

## 2024-04-16 DIAGNOSIS — Z3041 Encounter for surveillance of contraceptive pills: Secondary | ICD-10-CM

## 2024-04-16 DIAGNOSIS — Z793 Long term (current) use of hormonal contraceptives: Secondary | ICD-10-CM

## 2024-04-16 DIAGNOSIS — I10 Essential (primary) hypertension: Secondary | ICD-10-CM

## 2024-04-16 MED ORDER — SLYND 4 MG PO TABS: 1.0000 | ORAL_TABLET | Freq: Every day | ORAL | Status: AC

## 2024-04-16 NOTE — Progress Notes (Signed)
 Patient ID: Jeanette Morales, female   DOB: Oct 31, 1980, 43 y.o.   MRN: [id number] History of Present Illness: Jeanette Morales is a 43 year old white female, married, G2P1002 in for a well woman gyn exam. She is starting to think about mor permanent birth control.     Component Value Date/Time   DIAGPAP (A) 01/11/2023 0929    - Atypical squamous cells of undetermined significance (ASC-US )   DIAGPAP  10/27/2019 1000    - Negative for intraepithelial lesion or malignancy (NILM)   HPVHIGH Negative 01/11/2023 0929   HPVHIGH Negative 10/27/2019 1000   ADEQPAP  01/11/2023 0929    Satisfactory for evaluation; transformation zone component ABSENT.   ADEQPAP  10/27/2019 1000    Satisfactory for evaluation; transformation zone component PRESENT.    PCP is Dr Toribio  Current Medications, Allergies, Past Medical History, Past Surgical History, Family History and Social History were reviewed in [employer] electronic medical record.     Review of Systems: Patient denies any hearing loss, fatigue, blurred vision, shortness of breath, chest pain, abdominal pain, problems with bowel movements, urination, or intercourse. No joint pain or mood swings.  Has headaches at times    Physical Exam:BP (!) 150/84 (BP Location: Right Arm, Patient Position: Sitting, Cuff Size: Large)   Pulse 77   Ht 5' 4 (1.626 m)   Wt 246 lb (111.6 kg)   LMP 04/09/2024 (Approximate)   BMI 42.23 kg/m   General:  Well developed, well nourished, no acute distress Skin:  Warm and dry Neck:  Midline trachea, normal thyroid, good ROM, no lymphadenopathy Lungs; Clear to auscultation bilaterally Breast:  No dominant palpable mass, retraction, or nipple discharge Cardiovascular: Regular rate and rhythm Abdomen:  Soft, non tender, no hepatosplenomegaly Pelvic:  External genitalia is normal in appearance, no lesions.  The vagina is normal in appearance. Urethra has no lesions or masses. The cervix is bulbous.  Uterus is felt to be normal size,  shape, and contour.  No adnexal masses or tenderness noted.Bladder is non tender, no masses felt. Rectal: deferred Extremities/musculoskeletal:  No swelling or varicosities noted, no clubbing or cyanosis Psych:  No mood changes, alert and cooperative,seems happy AA is 0 Fall risk is low    04/16/2024   11:45 AM 01/11/2023    9:16 AM 10/27/2019    9:32 AM  Depression screen PHQ 2/9  Decreased Interest 1 1 0  Down, Depressed, Hopeless 0 0 0  PHQ - 2 Score 1 1 0  Altered sleeping 2 1 2   Tired, decreased energy 1 2 2   Change in appetite 3 0 2  Feeling bad or failure about yourself  1 0 0  Trouble concentrating 1 0 0  Moving slowly or fidgety/restless 0 0 0  Suicidal thoughts 0 0 0  PHQ-9 Score 9 4  6    Difficult doing work/chores   Not difficult at all     Data saved with a previous flowsheet row definition       04/16/2024   11:45 AM 01/11/2023    9:17 AM 10/27/2019    9:32 AM 09/11/2019    9:41 AM  GAD 7 : Generalized Anxiety Score  Nervous, Anxious, on Edge 0 0 0 1  Control/stop worrying 0 0 0 1  Worry too much - different things 0 0 0 1  Trouble relaxing 0 0 0 1  Restless 0 0 0 1  Easily annoyed or irritable 0 0 0 1  Afraid - awful might happen 0  0 0 0  Total GAD 7 Score 0 0 0 6  Anxiety Difficulty    Somewhat difficult      Upstream - 04/16/24 1158       Pregnancy Intention Screening   Does the patient want to become pregnant in the next year? No    Does the patient's partner want to become pregnant in the next year? No    Would the patient like to discuss contraceptive options today? No      Contraception Wrap Up   Current Method Oral Contraceptive    End Method Oral Contraceptive    Contraception Counseling Provided Yes          Examination chaperoned by Clarita Salt LPN  Impression and plan: 1. Encounter for well woman exam with routine gynecological exam (Primary) Pap in 2027 Physical in 1 year Mammogram was negative 02/14/24 - CBC - Comprehensive  metabolic panel with GFR - Lipid panel  2. Hypertension, unspecified type Continue hyzaar 50-12.5 mg 1 daily, has refills Will recheck in 2 months - Comprehensive metabolic panel with GFR  3. Encounter for surveillance of contraceptive pills Stop aurovela and start slynd , 3 packs given, can start in am, and use condoms for 1 pack Follow up in 2 months for ROS  4. Elevated cholesterol  - Lipid panel

## 2024-06-04 ENCOUNTER — Ambulatory Visit (INDEPENDENT_AMBULATORY_CARE_PROVIDER_SITE_OTHER): Admitting: Podiatry

## 2024-06-04 ENCOUNTER — Encounter: Payer: Self-pay | Admitting: Podiatry

## 2024-06-04 DIAGNOSIS — Z01818 Encounter for other preprocedural examination: Secondary | ICD-10-CM

## 2024-06-04 DIAGNOSIS — M722 Plantar fascial fibromatosis: Secondary | ICD-10-CM | POA: Diagnosis not present

## 2024-06-04 NOTE — Progress Notes (Signed)
 "  Subjective:  Patient ID: Jeanette Morales, female    DOB: [birth year],  MRN: [id number]  Chief Complaint  Patient presents with   Plantar Fasciitis    It's the same thing, Plantar Fasciitis.    DOS: 06/26/23  Procedure: Right plantar fasciotomy   44 y.o. female returns for follow-up of her right foot plantar fasciitis.  It has been almost a year now since plantar fasciotomy and patient continuing to still have pain.  She had last discussed doing PRP injections but I did some research and decided she did not want to proceed with this.  She is here to discuss further surgical options.  Review of Systems: Negative except as noted in the HPI. Denies N/V/F/Ch.  Past Medical History:  Diagnosis Date   Hypertension    Palpitations     Current Outpatient Medications:    DULoxetine (CYMBALTA) 30 MG capsule, Take 30 mg by mouth daily., Disp: , Rfl:    losartan -hydrochlorothiazide (HYZAAR) 50-12.5 MG tablet, TAKE 1 TABLET BY MOUTH EVERY DAY, Disp: 90 tablet, Rfl: 3   Drospirenone  (SLYND ) 4 MG TABS, Take 1 tablet (4 mg total) by mouth daily. (Patient not taking: Reported on 06/04/2024), Disp: 84 tablet, Rfl:    ondansetron  (ZOFRAN ) 4 MG tablet, Take 1 tablet (4 mg total) by mouth every 8 (eight) hours as needed for nausea or vomiting. (Patient not taking: Reported on 06/04/2024), Disp: 20 tablet, Rfl: 0  Social History   Tobacco Use  Smoking Status Never  Smokeless Tobacco Never    No Known Allergies  Objective:  There were no vitals filed for this visit. There is no height or weight on file to calculate BMI. Constitutional Well developed. Well nourished.  Vascular Foot warm and well perfused. Capillary refill normal to all digits.   Neurologic Normal speech. Oriented to person, place, and time. Epicritic sensation to light touch grossly present bilaterally.  Dermatologic Skin healing well without signs of infection. Skin edges well coapted without signs of infection. Tender to medial  calcaneal tubercle on the right.   Orthopedic: Tenderness to palpation noted about the surgical site.  Decreased range of motion with ankle dorsiflexion    Assessment:   1. Plantar fasciitis of right foot   2. Encounter for preoperative examination for general surgical procedure        Plan:  Patient was evaluated and treated and all questions answered.  S/p foot surgery right Discussed plantar fasciitis with patient.  X-rays reviewed and discussed with patient. No acute fractures or dislocations noted. Mild spurring noted at inferior calcaneus.  Discussed treatment options including, ice, NSAIDS, supportive shoes, bracing, and stretching.  Patient has exhausted conservative treatments and ready discuss next steps.  Discussed with patient gastrocnemius resection on the right foot.  Discussed perioperative course and alternative treatments. -Informed surgical risk consent was reviewed and read aloud to the patient.  I reviewed the films.  I have discussed my findings with the patient in great detail.  I have discussed all risks including but not limited to infection, stiffness, scarring, limp, disability, deformity, damage to blood vessels and nerves, numbness, poor healing, need for braces, arthritis, chronic pain, amputation, death.  All benefits and realistic expectations discussed in great detail.  I have made no promises as to the outcome.  I have provided realistic expectations.  I have offered the patient a 2nd opinion, which they have declined and assured me they preferred to proceed despite the risks. Plan for surgery March timeframe. Postop meds:  oxycodone  5-325 mg, Zofran       Return for PRP injection with ultrasound. .    Return if symptoms worsen or fail to improve.   "

## 2024-06-04 NOTE — Patient Instructions (Signed)

## 2024-06-17 ENCOUNTER — Ambulatory Visit: Admitting: Adult Health
